# Patient Record
Sex: Female | Born: 1970
Health system: Southern US, Community
[De-identification: ages and names within clinical notes are randomized; demographics above are authoritative.]

## PROBLEM LIST (undated history)

## (undated) DIAGNOSIS — N200 Calculus of kidney: Secondary | ICD-10-CM

## (undated) DIAGNOSIS — N809 Endometriosis, unspecified: Secondary | ICD-10-CM

## (undated) DIAGNOSIS — F419 Anxiety disorder, unspecified: Secondary | ICD-10-CM

## (undated) DIAGNOSIS — K589 Irritable bowel syndrome without diarrhea: Secondary | ICD-10-CM

## (undated) DIAGNOSIS — I10 Essential (primary) hypertension: Secondary | ICD-10-CM

## (undated) DIAGNOSIS — I2510 Atherosclerotic heart disease of native coronary artery without angina pectoris: Secondary | ICD-10-CM

## (undated) HISTORY — DX: Essential (primary) hypertension: I10

## (undated) HISTORY — DX: Calculus of kidney: N20.0

## (undated) HISTORY — PX: LAPAROSCOPIC ABDOMINAL EXPLORATION: SHX6249

## (undated) HISTORY — DX: Endometriosis, unspecified: N80.9

## (undated) HISTORY — DX: Atherosclerotic heart disease of native coronary artery without angina pectoris: I25.10

## (undated) HISTORY — DX: Irritable bowel syndrome, unspecified: K58.9

## (undated) HISTORY — PX: CHOLECYSTECTOMY: SHX55

---

## 1997-09-03 ENCOUNTER — Other Ambulatory Visit: Admission: RE | Admit: 1997-09-03 | Discharge: 1997-09-03 | Payer: Self-pay | Admitting: Obstetrics & Gynecology

## 1997-11-28 ENCOUNTER — Encounter: Admission: RE | Admit: 1997-11-28 | Discharge: 1998-02-26 | Payer: Self-pay | Admitting: Obstetrics and Gynecology

## 1997-12-06 ENCOUNTER — Inpatient Hospital Stay (HOSPITAL_COMMUNITY): Admission: AD | Admit: 1997-12-06 | Discharge: 1997-12-07 | Payer: Self-pay | Admitting: Obstetrics & Gynecology

## 1998-02-03 ENCOUNTER — Inpatient Hospital Stay (HOSPITAL_COMMUNITY): Admission: AD | Admit: 1998-02-03 | Discharge: 1998-02-03 | Payer: Self-pay | Admitting: Obstetrics and Gynecology

## 1998-02-10 ENCOUNTER — Inpatient Hospital Stay (HOSPITAL_COMMUNITY): Admission: AD | Admit: 1998-02-10 | Discharge: 1998-02-10 | Payer: Self-pay | Admitting: Obstetrics and Gynecology

## 1998-02-24 ENCOUNTER — Inpatient Hospital Stay (HOSPITAL_COMMUNITY): Admission: AD | Admit: 1998-02-24 | Discharge: 1998-02-26 | Payer: Self-pay | Admitting: Obstetrics and Gynecology

## 1998-03-25 ENCOUNTER — Encounter (HOSPITAL_COMMUNITY): Admission: RE | Admit: 1998-03-25 | Discharge: 1998-06-23 | Payer: Self-pay | Admitting: Obstetrics & Gynecology

## 1998-04-08 ENCOUNTER — Other Ambulatory Visit: Admission: RE | Admit: 1998-04-08 | Discharge: 1998-04-08 | Payer: Self-pay | Admitting: Obstetrics & Gynecology

## 1998-07-13 ENCOUNTER — Encounter (HOSPITAL_COMMUNITY): Admission: RE | Admit: 1998-07-13 | Discharge: 1998-10-11 | Payer: Self-pay | Admitting: *Deleted

## 1998-10-02 ENCOUNTER — Emergency Department (HOSPITAL_COMMUNITY): Admission: EM | Admit: 1998-10-02 | Discharge: 1998-10-02 | Payer: Self-pay | Admitting: Emergency Medicine

## 1998-10-30 ENCOUNTER — Ambulatory Visit (HOSPITAL_COMMUNITY): Admission: RE | Admit: 1998-10-30 | Discharge: 1998-10-30 | Payer: Self-pay | Admitting: Internal Medicine

## 1998-10-30 ENCOUNTER — Encounter: Payer: Self-pay | Admitting: Internal Medicine

## 1999-03-10 ENCOUNTER — Encounter: Payer: Self-pay | Admitting: Internal Medicine

## 1999-03-10 ENCOUNTER — Encounter: Admission: RE | Admit: 1999-03-10 | Discharge: 1999-03-10 | Payer: Self-pay | Admitting: Internal Medicine

## 1999-03-30 ENCOUNTER — Other Ambulatory Visit: Admission: RE | Admit: 1999-03-30 | Discharge: 1999-03-30 | Payer: Self-pay | Admitting: Obstetrics & Gynecology

## 1999-08-09 ENCOUNTER — Encounter: Payer: Self-pay | Admitting: Internal Medicine

## 1999-08-09 ENCOUNTER — Encounter: Admission: RE | Admit: 1999-08-09 | Discharge: 1999-08-09 | Payer: Self-pay | Admitting: Internal Medicine

## 2000-07-04 ENCOUNTER — Other Ambulatory Visit: Admission: RE | Admit: 2000-07-04 | Discharge: 2000-07-04 | Payer: Self-pay | Admitting: Obstetrics & Gynecology

## 2001-07-24 ENCOUNTER — Encounter: Payer: Self-pay | Admitting: Obstetrics and Gynecology

## 2001-07-24 ENCOUNTER — Ambulatory Visit (HOSPITAL_COMMUNITY): Admission: RE | Admit: 2001-07-24 | Discharge: 2001-07-24 | Payer: Self-pay | Admitting: Ophthalmology

## 2001-08-06 ENCOUNTER — Inpatient Hospital Stay (HOSPITAL_COMMUNITY): Admission: AD | Admit: 2001-08-06 | Discharge: 2001-08-06 | Payer: Self-pay | Admitting: Obstetrics & Gynecology

## 2001-08-09 ENCOUNTER — Inpatient Hospital Stay (HOSPITAL_COMMUNITY): Admission: AD | Admit: 2001-08-09 | Discharge: 2001-08-09 | Payer: Self-pay | Admitting: Obstetrics and Gynecology

## 2001-08-27 ENCOUNTER — Inpatient Hospital Stay (HOSPITAL_COMMUNITY): Admission: AD | Admit: 2001-08-27 | Discharge: 2001-08-29 | Payer: Self-pay | Admitting: Obstetrics and Gynecology

## 2001-08-30 ENCOUNTER — Encounter: Admission: RE | Admit: 2001-08-30 | Discharge: 2001-09-29 | Payer: Self-pay | Admitting: Obstetrics & Gynecology

## 2001-08-30 ENCOUNTER — Observation Stay (HOSPITAL_COMMUNITY): Admission: AD | Admit: 2001-08-30 | Discharge: 2001-08-31 | Payer: Self-pay | Admitting: Obstetrics and Gynecology

## 2001-10-01 ENCOUNTER — Encounter: Admission: RE | Admit: 2001-10-01 | Discharge: 2001-10-31 | Payer: Self-pay | Admitting: Obstetrics & Gynecology

## 2001-10-16 ENCOUNTER — Other Ambulatory Visit: Admission: RE | Admit: 2001-10-16 | Discharge: 2001-10-16 | Payer: Self-pay | Admitting: Obstetrics & Gynecology

## 2001-11-30 ENCOUNTER — Encounter: Admission: RE | Admit: 2001-11-30 | Discharge: 2001-12-30 | Payer: Self-pay | Admitting: Obstetrics & Gynecology

## 2001-12-03 ENCOUNTER — Emergency Department (HOSPITAL_COMMUNITY): Admission: EM | Admit: 2001-12-03 | Discharge: 2001-12-03 | Payer: Self-pay | Admitting: Emergency Medicine

## 2001-12-05 ENCOUNTER — Encounter: Payer: Self-pay | Admitting: Internal Medicine

## 2001-12-05 ENCOUNTER — Encounter: Admission: RE | Admit: 2001-12-05 | Discharge: 2001-12-05 | Payer: Self-pay | Admitting: Internal Medicine

## 2002-01-30 ENCOUNTER — Encounter: Admission: RE | Admit: 2002-01-30 | Discharge: 2002-03-01 | Payer: Self-pay | Admitting: Obstetrics & Gynecology

## 2002-04-01 ENCOUNTER — Encounter: Admission: RE | Admit: 2002-04-01 | Discharge: 2002-05-01 | Payer: Self-pay | Admitting: Obstetrics & Gynecology

## 2002-05-02 ENCOUNTER — Encounter: Admission: RE | Admit: 2002-05-02 | Discharge: 2002-06-01 | Payer: Self-pay | Admitting: Obstetrics & Gynecology

## 2002-05-22 ENCOUNTER — Encounter: Admission: RE | Admit: 2002-05-22 | Discharge: 2002-05-22 | Payer: Self-pay | Admitting: Internal Medicine

## 2002-05-22 ENCOUNTER — Encounter: Payer: Self-pay | Admitting: Internal Medicine

## 2002-06-03 ENCOUNTER — Encounter: Admission: RE | Admit: 2002-06-03 | Discharge: 2002-06-03 | Payer: Self-pay | Admitting: Internal Medicine

## 2002-06-03 ENCOUNTER — Encounter: Payer: Self-pay | Admitting: Internal Medicine

## 2002-06-10 ENCOUNTER — Encounter: Payer: Self-pay | Admitting: Internal Medicine

## 2002-06-10 ENCOUNTER — Encounter: Admission: RE | Admit: 2002-06-10 | Discharge: 2002-06-10 | Payer: Self-pay | Admitting: Internal Medicine

## 2002-06-21 ENCOUNTER — Encounter: Admission: RE | Admit: 2002-06-21 | Discharge: 2002-06-21 | Payer: Self-pay | Admitting: Gastroenterology

## 2002-06-21 ENCOUNTER — Encounter: Payer: Self-pay | Admitting: Gastroenterology

## 2002-07-01 ENCOUNTER — Encounter: Admission: RE | Admit: 2002-07-01 | Discharge: 2002-07-31 | Payer: Self-pay | Admitting: Obstetrics & Gynecology

## 2002-12-19 ENCOUNTER — Other Ambulatory Visit: Admission: RE | Admit: 2002-12-19 | Discharge: 2002-12-19 | Payer: Self-pay | Admitting: Obstetrics & Gynecology

## 2002-12-24 ENCOUNTER — Encounter: Admission: RE | Admit: 2002-12-24 | Discharge: 2002-12-24 | Payer: Self-pay | Admitting: Obstetrics & Gynecology

## 2002-12-24 ENCOUNTER — Encounter: Payer: Self-pay | Admitting: Obstetrics & Gynecology

## 2003-03-25 ENCOUNTER — Encounter: Admission: RE | Admit: 2003-03-25 | Discharge: 2003-03-25 | Payer: Self-pay | Admitting: Family Medicine

## 2003-04-15 ENCOUNTER — Encounter: Admission: RE | Admit: 2003-04-15 | Discharge: 2003-04-15 | Payer: Self-pay | Admitting: Internal Medicine

## 2003-06-16 ENCOUNTER — Emergency Department (HOSPITAL_COMMUNITY): Admission: EM | Admit: 2003-06-16 | Discharge: 2003-06-16 | Payer: Self-pay | Admitting: Emergency Medicine

## 2003-06-26 ENCOUNTER — Ambulatory Visit (HOSPITAL_COMMUNITY): Admission: RE | Admit: 2003-06-26 | Discharge: 2003-06-26 | Payer: Self-pay | Admitting: Gastroenterology

## 2004-05-06 ENCOUNTER — Other Ambulatory Visit: Admission: RE | Admit: 2004-05-06 | Discharge: 2004-05-06 | Payer: Self-pay | Admitting: Obstetrics & Gynecology

## 2004-10-19 IMAGING — CT CT PARANASAL SINUSES LIMITED
1 series · 16 of 28 positions shown, 20 images · non-contrast
Comparison: none

CLINICAL DATA: Three weeks headaches.
 LIMITED PARANASAL SINUS CT ? NO CONTRAST

[Series 2: limited sinus · axial · 0.33mm/px · z∈[-4,+95]mm · 16 of 28 slices shown, 20 images]
[im 2/28  brain]
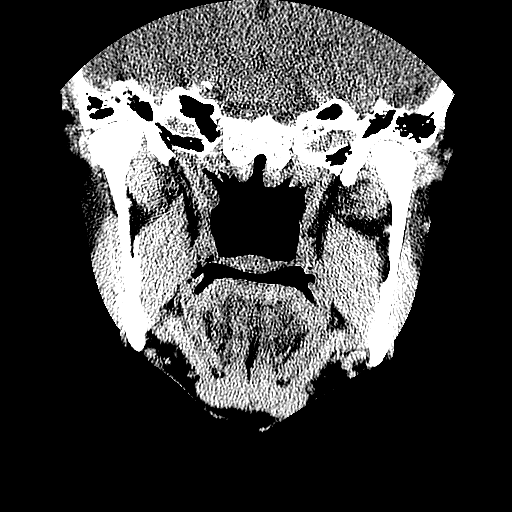
[im 2/28  bone]
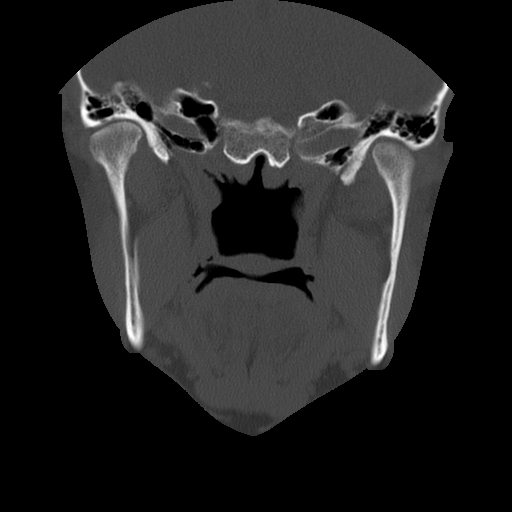
[im 4/28  bone]
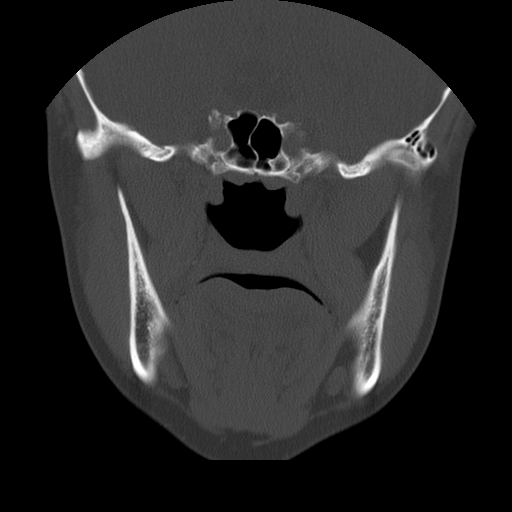
[im 6/28  bone]
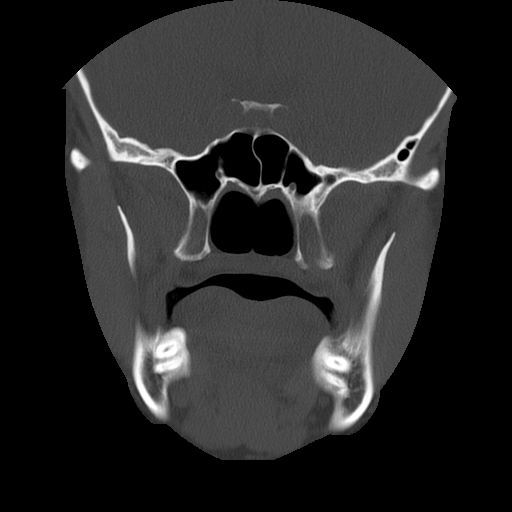
[im 7/28  bone]
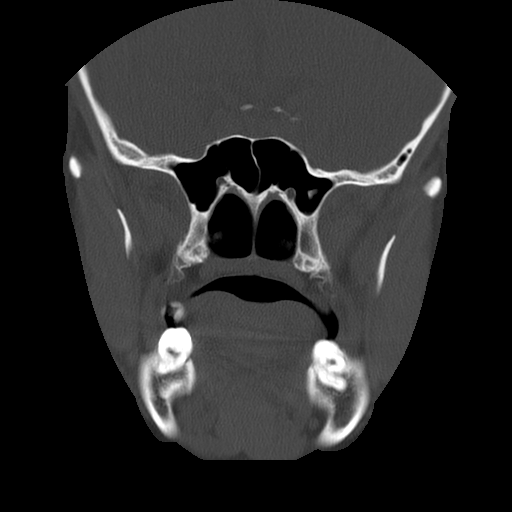
[im 9/28  brain]
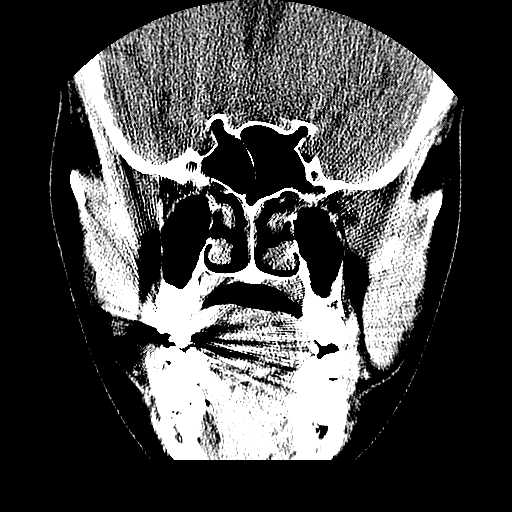
[im 9/28  bone]
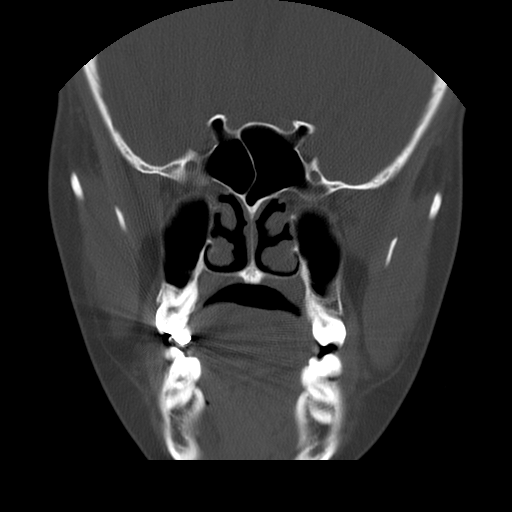
[im 10/28  bone]
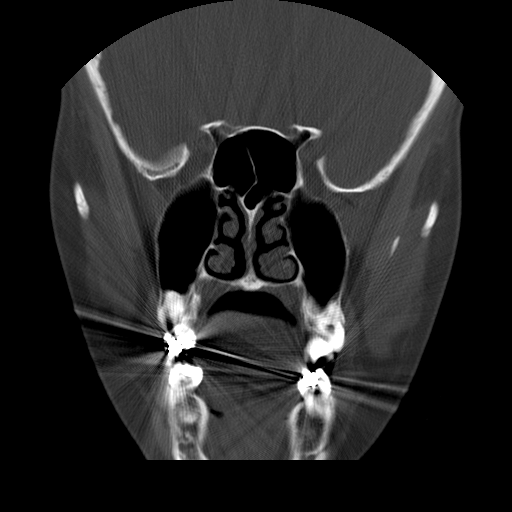
[im 12/28  bone]
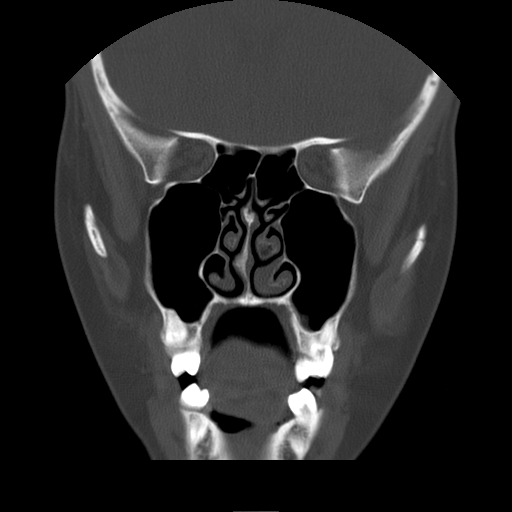
[im 14/28  bone]
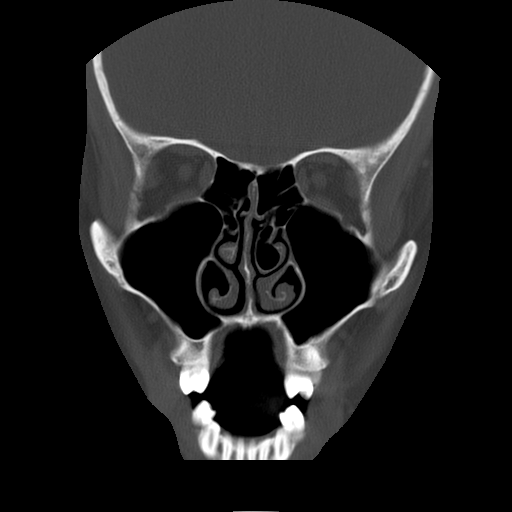
[im 15/28  brain]
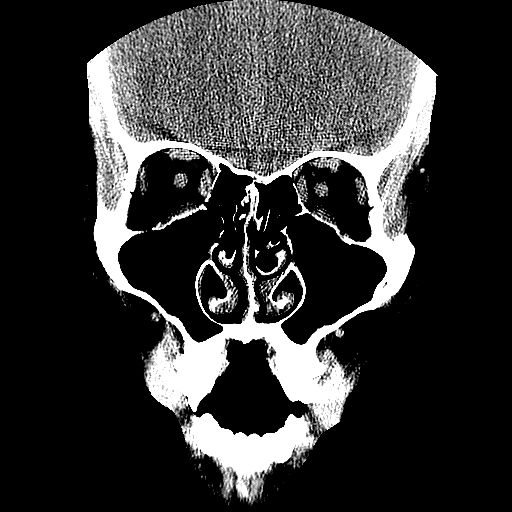
[im 15/28  bone]
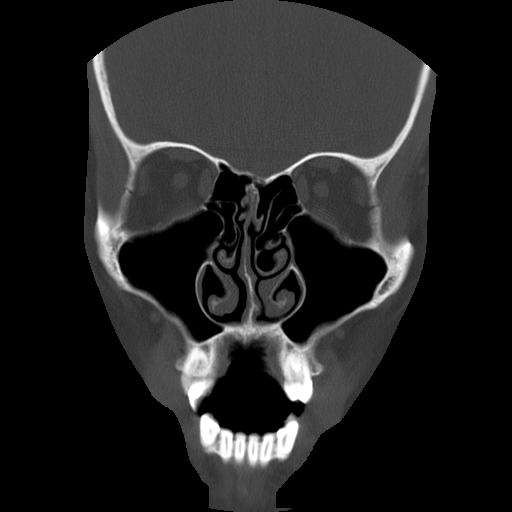
[im 17/28  bone]
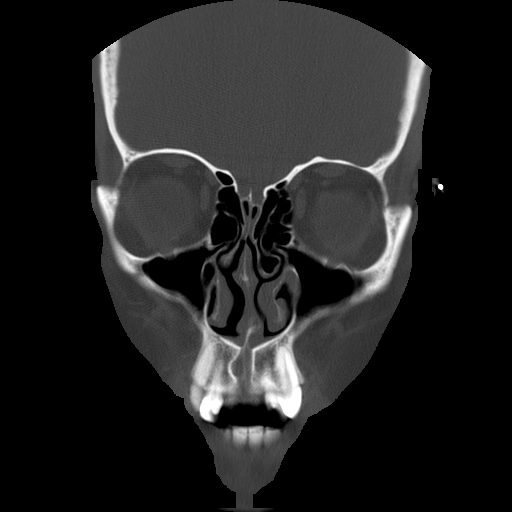
[im 19/28  bone]
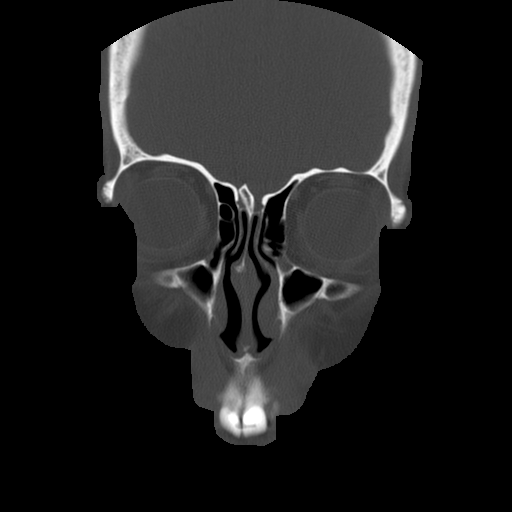
[im 20/28  bone]
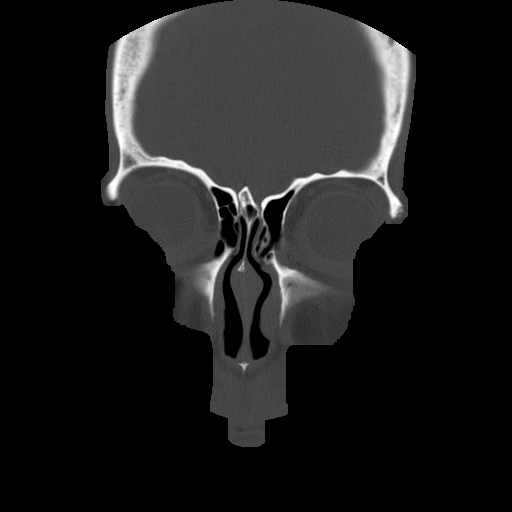
[im 22/28  brain]
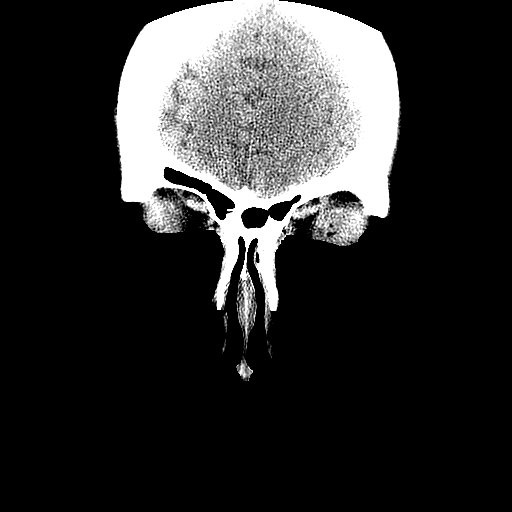
[im 22/28  bone]
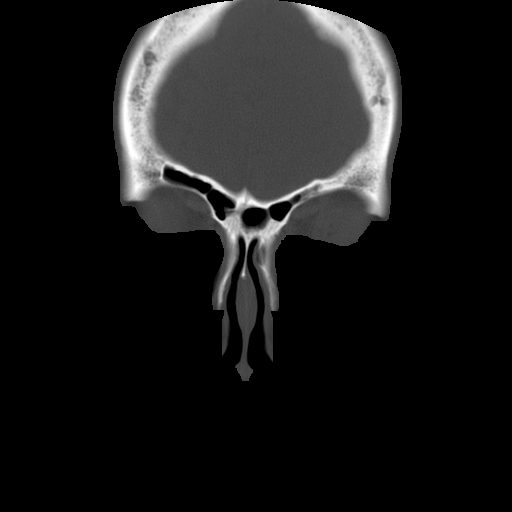
[im 23/28  bone]
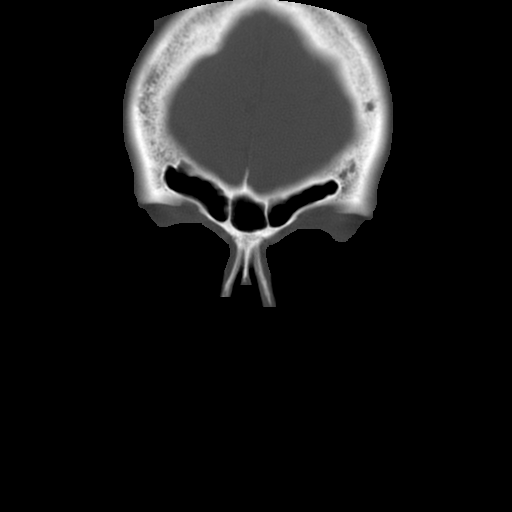
[im 25/28  bone]
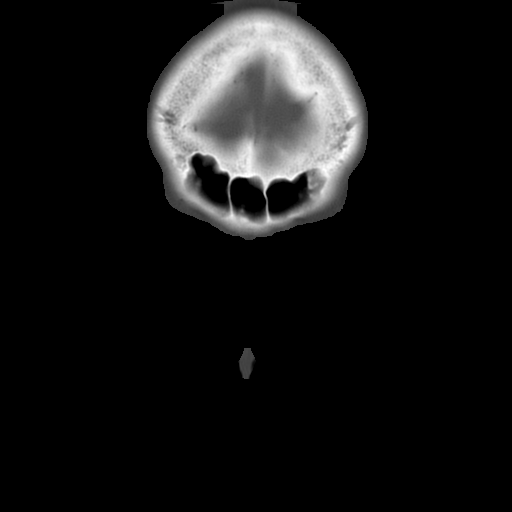
[im 27/28  bone]
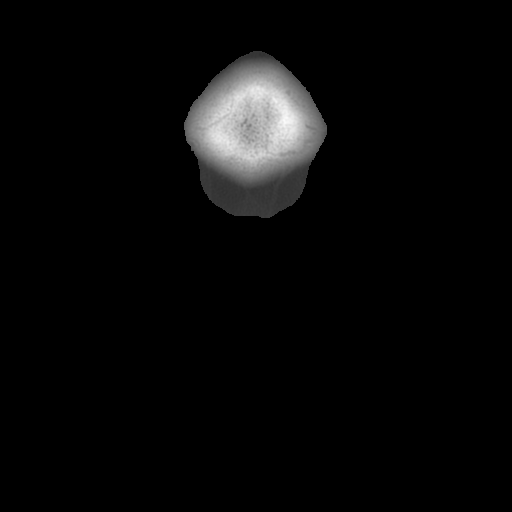

[16 of 28 positions shown; findings below may reference images not displayed]

FINDINGS: Selected direct coronal images were obtained through the major paranasal sinuses with no IV contrast.  The paranasal sinuses appear clear.  Slight rightward nasal septal deviation is seen at the inferior nasal septum. Moderate right concha bullosa anatomic variant is noted.  
 IMPRESSION
 1.  Slight inferior rightward nasal septal deviation.
 2.  Right concha bullosa anatomic variant.
 3.  Clear paranasal sinuses.

## 2006-09-15 ENCOUNTER — Encounter: Admission: RE | Admit: 2006-09-15 | Discharge: 2006-09-15 | Payer: Self-pay | Admitting: Gastroenterology

## 2010-07-23 NOTE — Op Note (Signed)
NAME:  [name], [name]                            ACCOUNT NO.:  [id number]   MEDICAL RECORD NO.:  [id number]                   PATIENT TYPE:  AMB   LOCATION:  ENDO                                 FACILITY:  [facility]   PHYSICIAN:  Danise Edge, M.D.                DATE OF BIRTH:  12-Nov-1970   DATE OF PROCEDURE:  06/26/2003  DATE OF DISCHARGE:                                 OPERATIVE REPORT   PROCEDURE:  Esophagogastroduodenoscopy.   REFERRED BY:  Ike Bene, M.D.   INDICATIONS FOR PROCEDURE:  [name] is a 40 year old female born [birth year].  [name] takes Nexium each morning to control her  gastroesophageal reflux. Despite taking Nexium daily, she has discomfort  when she takes a deep breath in her chest and also intermittent abdominal  discomfort without dysphagia or odynophagia.   ENDOSCOPIST:  Danise Edge, M.D.   PREMEDICATION:  Versed 7.5 mg, Demerol 80 mg.   DESCRIPTION OF PROCEDURE:  After obtaining informed consent, [name] was  placed in the left lateral decubitus position. I administered intravenous  Demerol and intravenous Versed to achieve conscious sedation for the  procedure. The patient's blood pressure, oxygen saturation and cardiac  rhythm were monitored throughout the procedure and documented in the medical  record.   The Olympus gastroscope was passed through the posterior hypopharynx into  the proximal esophagus without difficulty. The hypopharynx and larynx  appeared normal. I did not visualize the vocal cords.   ESOPHAGOSCOPY:  The proximal, mid and lower segments of the esophageal  mucosa appeared completely normal. The squamocolumnar junction and  esophagogastric junction are noted at 40 cm from the incisor teeth.  Endoscopically, there is no evidence for the presence of erosive  esophagitis, esophageal mucosal scarring or Barrett's esophagus.   GASTROSCOPY:  Retroflexed view of the gastric cardia and fundus was normal.  The gastric  body, antrum and pylorus appeared normal.   DUODENOSCOPY:  The duodenal bulb, mid duodenum and distal duodenum appear  normal.   ASSESSMENT:  Normal esophagogastroduodenoscopy.                                               Danise Edge, M.D.    MJ/MEDQ  D:  06/26/2003  T:  06/27/2003  Job:  [id number]   cc:   Ike Bene, M.D.  [address]  [place]  [place] [address]  Fax: [phone]

## 2012-10-02 ENCOUNTER — Encounter: Payer: Self-pay | Admitting: Physician Assistant

## 2012-10-02 ENCOUNTER — Ambulatory Visit (INDEPENDENT_AMBULATORY_CARE_PROVIDER_SITE_OTHER): Payer: BC Managed Care – PPO | Admitting: Physician Assistant

## 2012-10-02 VITALS — BP 142/102 | HR 85 | Temp 97.3°F | Ht 66.0 in | Wt 175.0 lb

## 2012-10-02 DIAGNOSIS — K589 Irritable bowel syndrome without diarrhea: Secondary | ICD-10-CM

## 2012-10-02 DIAGNOSIS — K625 Hemorrhage of anus and rectum: Secondary | ICD-10-CM

## 2012-10-02 DIAGNOSIS — N809 Endometriosis, unspecified: Secondary | ICD-10-CM

## 2012-10-02 DIAGNOSIS — R109 Unspecified abdominal pain: Secondary | ICD-10-CM

## 2012-10-02 LAB — POCT CBC
Granulocyte percent: 69 %G (ref 37–80)
HCT, POC: 43.6 % (ref 37.7–47.9)
Hemoglobin: 14.2 g/dL (ref 12.2–16.2)
Lymph, poc: 1.8 (ref 0.6–3.4)
MCH, POC: 28.9 pg (ref 27–31.2)
MCHC: 32.5 g/dL (ref 31.8–35.4)
MCV: 88.8 fL (ref 80–97)
MPV: 8.5 fL (ref 0–99.8)
POC Granulocyte: 4.8 (ref 2–6.9)
POC LYMPH PERCENT: 25.7 %L (ref 10–50)
Platelet Count, POC: 218 10*3/uL (ref 142–424)
RBC: 4.9 M/uL (ref 4.04–5.48)
RDW, POC: 13.3 %
WBC: 6.9 10*3/uL (ref 4.6–10.2)

## 2012-10-02 NOTE — Progress Notes (Signed)
Subjective:     Patient ID: Kelly Salazar, female   DOB: [birth year], 42 y.o.   MRN: [id number]  HPI Pt with a sudden onset of N/V, diarrhea, and rectal bleeding She states there is a hx of IBS that has been predom. Constipation Then she had her gallbladder removed and since has had diarrhea She has tried OTC meds for sx relief but they have had not helped On occasion she has not been able to make it to the restroom Last pm she had sudden onset of abd pain that then turned to N/V Sh has also had rectal bleeding This is bright red and changing the color of the water No hx of same There is FH of Colon Ca in maternal grandmother  Review of Systems  All other systems reviewed and are negative.       Objective:   Physical Exam  Vitals reviewed. Heart- RRR w/o M Lungs- CTA Abd- Soft,Diffuse TTP, no guarding, no masses/HSM CBC- see labs Rectal- Hemoccult positive, no masses Anoscope-  + internal hem.     Assessment:     Abd pain Rectal bleeding    Plan:     Given recent sx, chronic hx, and FH referral to GI Bland diet Hold caff, nict use, and OTC NSAIDS F/U with GI

## 2012-10-02 NOTE — Patient Instructions (Signed)
Rectal Bleeding Rectal bleeding is when blood passes out of the anus. It is usually a sign that something is wrong. It may not be serious, but it should always be evaluated. Rectal bleeding may present as bright red blood or extremely dark stools. The color may range from dark red or maroon to black (like tar). It is important that the cause of rectal bleeding be identified so treatment can be started and the problem corrected. CAUSES   Hemorrhoids. These are enlarged (dilated) blood vessels or veins in the anal or rectal area.  Fistulas. Theseare abnormal, burrowing channels that usually run from inside the rectum to the skin around the anus. They can bleed.  Anal fissures. This is a tear in the tissue of the anus. Bleeding occurs with bowel movements.  Diverticulosis. This is a condition in which pockets or sacs project from the bowel wall. Occasionally, the sacs can bleed.  Diverticulitis. Thisis an infection involving diverticulosis of the colon.  Proctitis and colitis. These are conditions in which the rectum, colon, or both, can become inflamed and pitted (ulcerated).  Polyps and cancer. Polyps are non-cancerous (benign) growths in the colon that may bleed. Certain types of polyps turn into cancer.  Protrusion of the rectum. Part of the rectum can project from the anus and bleed.  Certain medicines.  Intestinal infections.  Blood vessel abnormalities. HOME CARE INSTRUCTIONS  Eat a high-fiber diet to keep your stool soft.  Limit activity.  Drink enough fluids to keep your urine clear or pale yellow.  Warm baths may be useful to soothe rectal pain.  Follow up with your caregiver as directed. SEEK IMMEDIATE MEDICAL CARE IF:  You develop increased bleeding.  You have black or dark red stools.  You vomit blood or material that looks like coffee grounds.  You have abdominal pain or tenderness.  You have a fever.  You feel weak, nauseous, or you faint.  You have  severe rectal pain or you are unable to have a bowel movement. MAKE SURE YOU:  Understand these instructions.  Will watch your condition.  Will get help right away if you are not doing well or get worse. Document Released: 08/13/2001 Document Revised: 05/16/2011 Document Reviewed: 08/08/2010 Exit[employer] Patient Information 2014 [employer].  

## 2012-10-08 ENCOUNTER — Other Ambulatory Visit (INDEPENDENT_AMBULATORY_CARE_PROVIDER_SITE_OTHER): Payer: Self-pay | Admitting: *Deleted

## 2012-10-08 ENCOUNTER — Encounter (INDEPENDENT_AMBULATORY_CARE_PROVIDER_SITE_OTHER): Payer: Self-pay | Admitting: Internal Medicine

## 2012-10-08 ENCOUNTER — Telehealth (INDEPENDENT_AMBULATORY_CARE_PROVIDER_SITE_OTHER): Payer: Self-pay | Admitting: *Deleted

## 2012-10-08 ENCOUNTER — Ambulatory Visit (INDEPENDENT_AMBULATORY_CARE_PROVIDER_SITE_OTHER): Payer: BC Managed Care – PPO | Admitting: Internal Medicine

## 2012-10-08 VITALS — BP 112/80 | HR 66 | Ht 66.0 in | Wt 175.7 lb

## 2012-10-08 DIAGNOSIS — R197 Diarrhea, unspecified: Secondary | ICD-10-CM

## 2012-10-08 DIAGNOSIS — R110 Nausea: Secondary | ICD-10-CM

## 2012-10-08 DIAGNOSIS — Z1211 Encounter for screening for malignant neoplasm of colon: Secondary | ICD-10-CM

## 2012-10-08 DIAGNOSIS — K625 Hemorrhage of anus and rectum: Secondary | ICD-10-CM

## 2012-10-08 MED ORDER — PEG-KCL-NACL-NASULF-NA ASC-C 100 G PO SOLR: 1.0000 | Freq: Once | ORAL | Status: DC

## 2012-10-08 MED ORDER — ONDANSETRON HCL 4 MG PO TABS: 4.0000 mg | ORAL_TABLET | Freq: Three times a day (TID) | ORAL | Status: DC | PRN

## 2012-10-08 NOTE — Patient Instructions (Addendum)
Colonoscopy with Dr. Rehman. The risks and benefits such as perforation, bleeding, and infection were reviewed with the patient and is agreeable. 

## 2012-10-08 NOTE — Telephone Encounter (Signed)
Patient needs movi prep 

## 2012-10-08 NOTE — Progress Notes (Signed)
Subjective:     Patient ID: Kelly Salazar, female   DOB: 11-Nov-1970, 42 y.o.   MRN: [id number]  HPI Referred to our office by Dr. Zenda Alpers and Ignacia Bayley for rectal bleeding. Last Monday night (1 week ago). Noted to be heme positive. Anoscope reveal internal hemorrhoids, thrombosed She had a painful BM. She vomited x 1. She went to the bathroom and noticed bright red rectal bleeding. She had the rectal bleeding for 48 hrs. Diarrhea was associated with her symptoms.Diarrhea off and on x 5 months.  She had diarrhea before the rectal bleeding. She took Imodium and the diarrhea stopped. There has been no weight loss. Appetite is good for the most part. She has frequent nausea and takes Phenergan for the nausea. She has some abdominal pain today but she just started her period today. She usually has a BM x 5 a day. She also has fecal incontinence.  She takes Imodium and Metamucil for her diarrhea.  She says her BMs are extremely painful. She has nausea when she has the abdominal cramps.   Family Hx of colon cancer in a grandmother at age 48 Hemocult positive in PCP on 10/04/2012  CBC    Component Value Date/Time   WBC 6.9 10/02/2012 1320   RBC 4.9 10/02/2012 1320   HGB 14.2 10/02/2012 1320   HCT 43.6 10/02/2012 1320   MCV 88.8 10/02/2012 1320   MCH 28.9 10/02/2012 1320   MCHC 32.5 10/02/2012 1320   06/26/2003 EGD: GERD: Dr. Robin Searing: Normal EGD    Review of Systems  Current Outpatient Prescriptions  Medication Sig Dispense Refill  . ALPRAZolam (XANAX) 1 MG tablet Take 1 mg by mouth 3 (three) times daily as needed.      . nebivolol (BYSTOLIC) 2.5 MG tablet Take 2.5 mg by mouth daily.      Marland Kitchen oxyCODONE-acetaminophen (PERCOCET/ROXICET) 5-325 MG per tablet Take 1 tablet by mouth every 8 (eight) hours as needed for pain. Takes one to two days per month with period       No current facility-administered medications for this visit.   Past Medical History  Diagnosis Date  . IBS (irritable  bowel syndrome)   . Hypertension   . Endometriosis   . Kidney stones    Past Surgical History  Procedure Laterality Date  . Cholecystectomy    . Laparoscopic abdominal exploration      looking for endometriosis   Past Surgical History  Procedure Laterality Date  . Cholecystectomy    . Laparoscopic abdominal exploration      looking for endometriosis   Allergies  Allergen Reactions  . Penicillins Shortness Of Breath       Objective:   Physical Exam Alert and oriented. Skin warm and dry. Oral mucosa is moist.   . Sclera anicteric, conjunctivae is pink. Thyroid not enlarged. No cervical lymphadenopathy. Lungs clear. Heart regular rate and rhythm.  Abdomen is soft. Bowel sounds are positive. No hepatomegaly. No abdominal masses felt. No tenderness.  No edema to lower extremities.  Rectal exam deferred.     Assessment:    Rectal bleeding. Colonic neoplasm needs to be ruled out. Family hx of colon cancer. Nausea probably related to her abdominal cramps.     Plan:    Colonoscopy with Dr Karilyn Cota Rx for Zofran eprescribed to her pharmacy.

## 2012-10-11 ENCOUNTER — Encounter (HOSPITAL_COMMUNITY): Payer: Self-pay | Admitting: Pharmacy Technician

## 2012-10-31 ENCOUNTER — Encounter (HOSPITAL_COMMUNITY)
Admission: RE | Disposition: A | Discharge status: Home / Self Care | Payer: Self-pay | Source: Ambulatory Visit | Attending: Internal Medicine

## 2012-10-31 ENCOUNTER — Encounter (HOSPITAL_COMMUNITY): Payer: Self-pay | Admitting: *Deleted

## 2012-10-31 ENCOUNTER — Ambulatory Visit (HOSPITAL_COMMUNITY)
Admission: RE | Admit: 2012-10-31 | Discharge: 2012-10-31 | Disposition: A | Discharge status: Home / Self Care | Payer: BC Managed Care – PPO | Source: Ambulatory Visit | Attending: Internal Medicine | Admitting: Internal Medicine

## 2012-10-31 DIAGNOSIS — R198 Other specified symptoms and signs involving the digestive system and abdomen: Secondary | ICD-10-CM | POA: Insufficient documentation

## 2012-10-31 DIAGNOSIS — K921 Melena: Secondary | ICD-10-CM | POA: Insufficient documentation

## 2012-10-31 DIAGNOSIS — K644 Residual hemorrhoidal skin tags: Secondary | ICD-10-CM

## 2012-10-31 DIAGNOSIS — R197 Diarrhea, unspecified: Secondary | ICD-10-CM

## 2012-10-31 DIAGNOSIS — K6281 Anal sphincter tear (healed) (nontraumatic) (old): Secondary | ICD-10-CM

## 2012-10-31 DIAGNOSIS — Z8371 Family history of colonic polyps: Secondary | ICD-10-CM

## 2012-10-31 DIAGNOSIS — Z800 Family history of malignant neoplasm of digestive organs: Secondary | ICD-10-CM | POA: Insufficient documentation

## 2012-10-31 DIAGNOSIS — I10 Essential (primary) hypertension: Secondary | ICD-10-CM | POA: Insufficient documentation

## 2012-10-31 DIAGNOSIS — K602 Anal fissure, unspecified: Secondary | ICD-10-CM | POA: Insufficient documentation

## 2012-10-31 DIAGNOSIS — K625 Hemorrhage of anus and rectum: Secondary | ICD-10-CM

## 2012-10-31 DIAGNOSIS — R110 Nausea: Secondary | ICD-10-CM

## 2012-10-31 HISTORY — DX: Anxiety disorder, unspecified: F41.9

## 2012-10-31 HISTORY — PX: COLONOSCOPY: SHX5424

## 2012-10-31 SURGERY — COLONOSCOPY
Anesthesia: Moderate Sedation

## 2012-10-31 MED ORDER — MIDAZOLAM HCL 5 MG/5ML IJ SOLN
INTRAMUSCULAR | Status: AC
  Filled 2012-10-31: qty 10

## 2012-10-31 MED ORDER — DICYCLOMINE HCL 10 MG PO CAPS: 10.0000 mg | ORAL_CAPSULE | Freq: Two times a day (BID) | ORAL | Status: DC

## 2012-10-31 MED ORDER — MIDAZOLAM HCL 5 MG/5ML IJ SOLN
INTRAMUSCULAR | Status: DC | PRN
  Administered 2012-10-31: 3 mg via INTRAVENOUS
  Administered 2012-10-31 (×2): 2 mg via INTRAVENOUS
  Administered 2012-10-31: 3 mg via INTRAVENOUS
  Administered 2012-10-31: 2 mg via INTRAVENOUS

## 2012-10-31 MED ORDER — DOCUSATE SODIUM 100 MG PO CAPS: 200.0000 mg | ORAL_CAPSULE | Freq: Every day | ORAL | Status: DC

## 2012-10-31 MED ORDER — MEPERIDINE HCL 50 MG/ML IJ SOLN
INTRAMUSCULAR | Status: DC | PRN
  Administered 2012-10-31 (×2): 25 mg via INTRAVENOUS

## 2012-10-31 MED ORDER — PSYLLIUM 28 % PO PACK: 1.0000 | PACK | Freq: Every day | ORAL | Status: DC

## 2012-10-31 MED ORDER — PROMETHAZINE HCL 25 MG/ML IJ SOLN
INTRAMUSCULAR | Status: DC | PRN
  Administered 2012-10-31 (×2): 12.5 mg via INTRAVENOUS

## 2012-10-31 MED ORDER — SODIUM CHLORIDE 0.9 % IV SOLN
INTRAVENOUS | Status: DC
  Administered 2012-10-31: 12:00:00 via INTRAVENOUS

## 2012-10-31 MED ORDER — SODIUM CHLORIDE 0.9 % IJ SOLN
INTRAMUSCULAR | Status: AC
  Filled 2012-10-31: qty 10

## 2012-10-31 MED ORDER — PROMETHAZINE HCL 25 MG/ML IJ SOLN
INTRAMUSCULAR | Status: AC
  Filled 2012-10-31: qty 1

## 2012-10-31 MED ORDER — STERILE WATER FOR IRRIGATION IR SOLN
Status: DC | PRN
  Administered 2012-10-31: 12:00:00

## 2012-10-31 MED ORDER — MEPERIDINE HCL 50 MG/ML IJ SOLN
INTRAMUSCULAR | Status: AC
  Filled 2012-10-31: qty 1

## 2012-10-31 NOTE — Op Note (Addendum)
COLONOSCOPY PROCEDURE REPORT  PATIENT:  Kelly Salazar  MR#:  [id number] Birthdate:  1970-09-20, 42 y.o., female Endoscopist:  Dr. Malissa Hippo, MD Referred By:  Dr. Rudi Heap, MD Procedure Date: 10/31/2012  Procedure:   Colonoscopy  Indications:  Patient is a 42 year old Caucasian female with history of large volume hematochezia irregular bowel movements and painful defecation. Family history significant for colon carcinoma in maternal grandmother at age 82. Father has had multiple colonic polyps removed.  Informed Consent:  The procedure and risks were reviewed with the patient and informed consent was obtained.  Medications:  Demerol 50 mg IV Versed 12 mg IV Promethazine 25 mg IV and diluted form.  Description of procedure:  After a digital rectal exam was performed, that colonoscope was advanced from the anus through the rectum and colon to the area of the cecum, ileocecal valve and appendiceal orifice. The cecum was deeply intubated. These structures were well-seen and photographed for the record. From the level of the cecum and ileocecal valve, the scope was slowly and cautiously withdrawn. The mucosal surfaces were carefully surveyed utilizing scope tip to flexion to facilitate fold flattening as needed. The scope was pulled down into the rectum where a thorough exam including retroflexion was performed. Terminal ileum was also examined.  Findings:   Prep excellent. Normal mucosa of terminal ileum. Normal mucosa of colon and rectum. No evidence of colonic polyps. Small hemorrhoids below the dentate line along with small linear tear she appears to be healing.    Therapeutic/Diagnostic Maneuvers Performed:  None  Complications:  None  Cecal Withdrawal Time:  9 minutes  Impression:  Normal mucosa of terminal ileum. Normal colonoscopy except external hemorrhoids and small healing anal fissure.  Recommendations:  Standard instructions given. Dicyclomine 10 mg by mouth  twice a day. Colace 200 mg by mouth each bedtime. Metamucil 4 g by mouth daily. Office visit in 3 months.  Kelly Salazar U  10/31/2012 1:03 PM  CC: Dr. Rudi Heap, MD & Dr. Bonnetta Barry ref. provider found

## 2012-10-31 NOTE — H&P (Signed)
Kelly Salazar is an 42 y.o. female.   Chief Complaint: Patient is here for colonoscopy. HPI: Patient is a 42 year old Caucasian female who presents with history of large-volume hematochezia. She had a colonoscopy by her primary care physician and found to have internal hemorrhoids. She subsequently noted to have heme positive stools. She complains of irregular bowel movements. She has very painful defecation. She has history of IBS with constipation but since she had gallbladder surgery 2 years ago she started having diarrhea. She has good appetite and denies weight loss. Family history significant for colon carcinoma in maternal grandmother who was 60 at the time of diagnosis and died 10 years later following bowel surgery details unknown.  Past Medical History  Diagnosis Date  . IBS (irritable bowel syndrome)   . Hypertension   . Endometriosis   . Kidney stones   . Anxiety     Past Surgical History  Procedure Laterality Date  . Cholecystectomy    . Laparoscopic abdominal exploration      looking for endometriosis    Family History  Problem Relation Age of Onset  . Hypertension Father   . Other Other    Social History:  reports that she has been smoking Cigarettes.  She has a 6 pack-year smoking history. She has never used smokeless tobacco. She reports that she does not drink alcohol or use illicit drugs.  Allergies:  Allergies  Allergen Reactions  . Penicillins Shortness Of Breath    Medications Prior to Admission  Medication Sig Dispense Refill  . ALPRAZolam (XANAX) 1 MG tablet Take 1 mg by mouth 3 (three) times daily as needed for anxiety.       Marland Kitchen ibuprofen (ADVIL,MOTRIN) 200 MG tablet Take 400 mg by mouth every 6 (six) hours as needed for pain.      . nebivolol (BYSTOLIC) 2.5 MG tablet Take 2.5 mg by mouth daily.      . ondansetron (ZOFRAN) 4 MG tablet Take 1 tablet (4 mg total) by mouth every 8 (eight) hours as needed for nausea.  60 tablet  0  . oxyCODONE-acetaminophen  (PERCOCET/ROXICET) 5-325 MG per tablet Take 1 tablet by mouth every 8 (eight) hours as needed for pain. Takes one to two days per month with period      . promethazine (PHENERGAN) 25 MG tablet Take 25 mg by mouth every 6 (six) hours as needed for nausea.        No results found for this or any previous visit (from the past 48 hour(s)). No results found.  ROS  Blood pressure 133/93, pulse 79, temperature 98.4 F (36.9 C), temperature source Oral, resp. rate 14, height 5\' 6"  (1.676 m), weight 175 lb (79.379 kg), last menstrual period 10/06/2012, SpO2 99.00%. Physical Exam  Constitutional: She appears well-developed and well-nourished.  HENT:  Mouth/Throat: Oropharynx is clear and moist.  Eyes: Conjunctivae are normal. No scleral icterus.  Neck: No thyromegaly present.  Cardiovascular: Normal rate, regular rhythm and normal heart sounds.   No murmur heard. Respiratory: Effort normal and breath sounds normal.  GI: Soft. She exhibits no distension and no mass. There is no tenderness.  Musculoskeletal: She exhibits no edema.  Lymphadenopathy:    She has no cervical adenopathy.  Neurological: She is alert.  Skin: Skin is warm.     Assessment/Plan Hematochezia. Painful defecation. Family history of colon carcinoma in a second-degree relative. Diagnostic colonoscopy.  REHMAN,NAJEEB U 10/31/2012, 12:18 PM

## 2012-11-01 ENCOUNTER — Encounter (HOSPITAL_COMMUNITY): Payer: Self-pay | Admitting: Internal Medicine

## 2012-11-06 ENCOUNTER — Telehealth: Payer: Self-pay | Admitting: Family Medicine

## 2012-11-06 ENCOUNTER — Encounter (HOSPITAL_COMMUNITY): Payer: Self-pay

## 2012-11-06 ENCOUNTER — Telehealth (INDEPENDENT_AMBULATORY_CARE_PROVIDER_SITE_OTHER): Payer: Self-pay | Admitting: *Deleted

## 2012-11-06 NOTE — Telephone Encounter (Signed)
Patient was evaluated at a hospital. Patient underwent colonoscopy last week. She received Demerol, Versed and promethazine. She has developed phlebitis she had discomfort starting from her wrist extending to elbow in linear fashion. There is slight erythema to skin but no breakdown. Prescription given for Levaquin 500 mg by mouth daily for 7 days and Medrol Dosepak. Patient advised to call with progress report in 48 hours

## 2012-11-06 NOTE — Telephone Encounter (Signed)
Lupita Leash please disregard Dr.Rehman is having the patient to go to [facility] and they will assess it and have them let him know.

## 2012-11-06 NOTE — Telephone Encounter (Signed)
Kelly Salazar per Dr.Rehman , the patient needs to come in today for [place] to look at her hand and rule out the possible need for antibiotics.

## 2012-11-06 NOTE — Telephone Encounter (Signed)
See previous telephone call from today.

## 2012-11-06 NOTE — Telephone Encounter (Signed)
Patient called and made aware.

## 2012-11-06 NOTE — Telephone Encounter (Signed)
This is being forwarded to Dr.Rehman

## 2012-11-06 NOTE — Op Note (Signed)
Patient came in to Short stay area on 11/06/2012 per Dr. Cathie Beams order with complaints of swollen, bruised  right hand from IV that was placed and removed on 10/31/2012. Dr. Karilyn Cota assesed IV site and gave patient prescriptions. Patient instructed to follow up on Friday if area not better.

## 2012-11-06 NOTE — Telephone Encounter (Signed)
Kelly Salazar had a TCS last week and the IV cite on her hand is still swollen. It is soar with a knot and very concerned about it. Her return phone number is [phone].

## 2012-11-06 NOTE — Telephone Encounter (Signed)
Pt offered 3 appts and then decided that she would just go to urgent care instead , b/c of her busy schedule

## 2013-02-04 ENCOUNTER — Ambulatory Visit (INDEPENDENT_AMBULATORY_CARE_PROVIDER_SITE_OTHER): Payer: BC Managed Care – PPO | Admitting: Internal Medicine

## 2013-02-06 ENCOUNTER — Other Ambulatory Visit (HOSPITAL_COMMUNITY): Payer: Self-pay | Admitting: Obstetrics & Gynecology

## 2013-02-06 DIAGNOSIS — R102 Pelvic and perineal pain: Secondary | ICD-10-CM

## 2013-02-07 ENCOUNTER — Encounter (HOSPITAL_COMMUNITY): Payer: Self-pay

## 2013-02-07 ENCOUNTER — Telehealth (INDEPENDENT_AMBULATORY_CARE_PROVIDER_SITE_OTHER): Payer: Self-pay | Admitting: *Deleted

## 2013-02-07 ENCOUNTER — Ambulatory Visit (HOSPITAL_COMMUNITY)
Admission: RE | Admit: 2013-02-07 | Discharge: 2013-02-07 | Disposition: A | Discharge status: Home / Self Care | Payer: BC Managed Care – PPO | Source: Ambulatory Visit | Attending: Obstetrics & Gynecology | Admitting: Obstetrics & Gynecology

## 2013-02-07 ENCOUNTER — Encounter: Payer: Self-pay | Admitting: Family Medicine

## 2013-02-07 ENCOUNTER — Other Ambulatory Visit (HOSPITAL_COMMUNITY): Payer: Self-pay | Admitting: Obstetrics & Gynecology

## 2013-02-07 ENCOUNTER — Ambulatory Visit (INDEPENDENT_AMBULATORY_CARE_PROVIDER_SITE_OTHER): Payer: BC Managed Care – PPO | Admitting: Family Medicine

## 2013-02-07 VITALS — BP 133/96 | HR 88 | Temp 98.4°F | Wt 180.0 lb

## 2013-02-07 DIAGNOSIS — N200 Calculus of kidney: Secondary | ICD-10-CM | POA: Insufficient documentation

## 2013-02-07 DIAGNOSIS — G8929 Other chronic pain: Secondary | ICD-10-CM

## 2013-02-07 DIAGNOSIS — R1031 Right lower quadrant pain: Secondary | ICD-10-CM

## 2013-02-07 DIAGNOSIS — K589 Irritable bowel syndrome without diarrhea: Secondary | ICD-10-CM | POA: Insufficient documentation

## 2013-02-07 DIAGNOSIS — K449 Diaphragmatic hernia without obstruction or gangrene: Secondary | ICD-10-CM | POA: Insufficient documentation

## 2013-02-07 DIAGNOSIS — R112 Nausea with vomiting, unspecified: Secondary | ICD-10-CM | POA: Insufficient documentation

## 2013-02-07 DIAGNOSIS — N209 Urinary calculus, unspecified: Secondary | ICD-10-CM | POA: Insufficient documentation

## 2013-02-07 DIAGNOSIS — N949 Unspecified condition associated with female genital organs and menstrual cycle: Secondary | ICD-10-CM | POA: Insufficient documentation

## 2013-02-07 DIAGNOSIS — R102 Pelvic and perineal pain: Secondary | ICD-10-CM

## 2013-02-07 DIAGNOSIS — J9819 Other pulmonary collapse: Secondary | ICD-10-CM | POA: Insufficient documentation

## 2013-02-07 MED ORDER — FLUCONAZOLE 150 MG PO TABS: 150.0000 mg | ORAL_TABLET | Freq: Once | ORAL | Status: DC

## 2013-02-07 MED ORDER — IOHEXOL 300 MG/ML  SOLN
100.0000 mL | Freq: Once | INTRAMUSCULAR | Status: AC | PRN
  Administered 2013-02-07: 100 mL via INTRAVENOUS

## 2013-02-07 MED ORDER — LEVOFLOXACIN 500 MG PO TABS: 500.0000 mg | ORAL_TABLET | Freq: Every day | ORAL | Status: DC

## 2013-02-07 MED ORDER — METRONIDAZOLE 500 MG PO TABS: 500.0000 mg | ORAL_TABLET | Freq: Three times a day (TID) | ORAL | Status: DC

## 2013-02-07 NOTE — Progress Notes (Signed)
   Subjective:    Patient ID: Kelly Salazar, female    DOB: 03-23-1970, 42 y.o.   MRN: [id number]  HPI This 42 y.o. female presents for evaluation of lower abdominal pelvic pain.  She Was seen by her OBGYN for abdominal pelvic pain today and underwent a CT of the Abdomen and pelvis.  She has the CT report in her hand.  She wants a cbc she states Because she is worried about having appendicitis.  She has been having right pelvic pain And she had an [place] which showed a cyst and she states her OBGYN ordered a CT scan Of the pelvis and abdomen.  The results of the CT show diverticulitis, ovarian cysts, And no visualization of the appendix..   Review of Systems C/o abdominal pain. No chest pain, SOB, HA, dizziness, vision change, N/V, diarrhea, constipation, dysuria, urinary urgency or frequency, myalgias, arthralgias or rash.     Objective:   Physical Exam Vital signs noted  Well developed well nourished female.  HEENT - Head atraumatic Normocephalic                Eyes - PERRLA, Conjuctiva - clear Sclera- Clear EOMI                Ears - EAC's Wnl TM's Wnl Gross Hearing WNL                Nose - Nares patent                 Throat - oropharanx wnl Respiratory - Lungs CTA bilateral Cardiac - RRR S1 and S2 without murmur GI - Abdomen tender in LLQ, RLQ, and periumbilical, and bowel sounds active x 4. No guarding or rebound tenderness. Extremities - No edema. Neuro - Grossly intact.  Results for orders placed in visit on 10/02/12  POCT CBC      Result Value Range   WBC 6.9  4.6 - 10.2 K/uL   Lymph, poc 1.8  0.6 - 3.4   POC LYMPH PERCENT 25.7  10 - 50 %L   POC Granulocyte 4.8  2 - 6.9   Granulocyte percent 69.0  37 - 80 %G   RBC 4.9  4.04 - 5.48 M/uL   Hemoglobin 14.2  12.2 - 16.2 g/dL   HCT, POC 40.9  81.1 - 47.9 %   MCV 88.8  80 - 97 fL   MCH, POC 28.9  27 - 31.2 pg   MCHC 32.5  31.8 - 35.4 g/dL   RDW, POC 91.4     Platelet Count, POC 218.0  142 - 424 K/uL   MPV 8.5  0 -  99.8 fL       Assessment & Plan:  Abdominal pain, chronic, right lower quadrant - Plan: levofloxacin (LEVAQUIN) 500 MG tablet, metroNIDAZOLE (FLAGYL) 500 MG tablet, fluconazole (DIFLUCAN) 150 MG tablet, CANCELED: CBC With differential/Platelet  Bilateral adenexal cysts - Follow up with OBGYN.  Diverticulitis - She is currently on levaquin and will add flagyl and extend levaquin.  Discussed she should continue to follow up with OBGYN and get the exp lap surgery for her  Pelvic pain.  Deatra Canter FNP

## 2013-02-07 NOTE — Telephone Encounter (Addendum)
Kelly Salazar had a CT done on 02/07/13 at [facility]. She is having right side pain. If Dr. Karilyn Cota would please look at the report and return her call at [phone].  The report is in EPIC.

## 2013-02-07 NOTE — Telephone Encounter (Signed)
Loda would like to speak with Dr. Karilyn Cota today if at all possible. She is to have surgery tomorrow and had questions to ask.

## 2013-02-07 NOTE — Telephone Encounter (Signed)
CT films reviewed and patient's call returned. CT findings do not correspond to her symptoms. She has focal inflammatory changes which could be epiploicae appendigitis or focal colitis it would not explain her right-sided pain. She can proceed with diagnostic laparoscopy and will call us if she needs office visit.

## 2013-02-07 NOTE — Telephone Encounter (Signed)
Forwarded to Dr.Rehman to review, patient's next visit is 04/2013 with you. Procedure was done in August, 11-01-12. Then on 11-06-12, she had complication form her IV. Having bad pain in side, [facility]), she is requesting that you review them,(EPIC).

## 2013-02-07 NOTE — Patient Instructions (Signed)
Diverticulitis °A diverticulum is a small pouch or sac on the colon. Diverticulosis is the presence of these diverticula on the colon. Diverticulitis is the irritation (inflammation) or infection of diverticula. °CAUSES  °The colon and its diverticula contain bacteria. If food particles block the tiny opening to a diverticulum, the bacteria inside can grow and cause an increase in pressure. This leads to infection and inflammation and is called diverticulitis. °SYMPTOMS  °· Abdominal pain and tenderness. Usually, the pain is located on the left side of your abdomen. However, it could be located elsewhere. °· Fever. °· Bloating. °· Feeling sick to your stomach (nausea). °· Throwing up (vomiting). °· Abnormal stools. °DIAGNOSIS  °Your caregiver will take a history and perform a physical exam. Since many things can cause abdominal pain, other tests may be necessary. Tests may include: °· Blood tests. °· Urine tests. °· X-ray of the abdomen. °· CT scan of the abdomen. °Sometimes, surgery is needed to determine if diverticulitis or other conditions are causing your symptoms. °TREATMENT  °Most of the time, you can be treated without surgery. Treatment includes: °· Resting the bowels by only having liquids for a few days. As you improve, you will need to eat a low-fiber diet. °· Intravenous (IV) fluids if you are losing body fluids (dehydrated). °· Antibiotic medicines that treat infections may be given. °· Pain and nausea medicine, if needed. °· Surgery if the inflamed diverticulum has burst. °HOME CARE INSTRUCTIONS  °· Try a clear liquid diet (broth, tea, or water for as long as directed by your caregiver). You may then gradually begin a low-fiber diet as tolerated.  °A low-fiber diet is a diet with less than 10 grams of fiber. Choose the foods below to reduce fiber in the diet: °· White breads, cereals, rice, and pasta. °· Cooked fruits and vegetables or soft fresh fruits and vegetables without the skin. °· Ground or  well-cooked tender beef, ham, veal, lamb, pork, or poultry. °· Eggs and seafood. °· After your diverticulitis symptoms have improved, your caregiver may put you on a high-fiber diet. A high-fiber diet includes 14 grams of fiber for every 1000 calories consumed. For a standard 2000 calorie diet, you would need 28 grams of fiber. Follow these diet guidelines to help you increase the fiber in your diet. It is important to slowly increase the amount fiber in your diet to avoid gas, constipation, and bloating. °· Choose whole-grain breads, cereals, pasta, and brown rice. °· Choose fresh fruits and vegetables with the skin on. Do not overcook vegetables because the more vegetables are cooked, the more fiber is lost. °· Choose more nuts, seeds, legumes, dried peas, beans, and lentils. °· Look for food products that have greater than 3 grams of fiber per serving on the Nutrition Facts label. °· Take all medicine as directed by your caregiver. °· If your caregiver has given you a follow-up appointment, it is very important that you go. Not going could result in lasting (chronic) or permanent injury, pain, and disability. If there is any problem keeping the appointment, call to reschedule. °SEEK MEDICAL CARE IF:  °· Your pain does not improve. °· You have a hard time advancing your diet beyond clear liquids. °· Your bowel movements do not return to normal. °SEEK IMMEDIATE MEDICAL CARE IF:  °· Your pain becomes worse. °· You have an oral temperature above 102° F (38.9° C), not controlled by medicine. °· You have repeated vomiting. °· You have bloody or black, tarry stools. °·   Symptoms that brought you to your caregiver become worse or are not getting better. °MAKE SURE YOU:  °· Understand these instructions. °· Will watch your condition. °· Will get help right away if you are not doing well or get worse. °Document Released: 12/01/2004 Document Revised: 05/16/2011 Document Reviewed: 03/29/2010 °Exit[employer]® Patient Information  ©2014 [employer]. ° °

## 2013-04-22 ENCOUNTER — Ambulatory Visit (INDEPENDENT_AMBULATORY_CARE_PROVIDER_SITE_OTHER): Payer: BC Managed Care – PPO | Admitting: Internal Medicine

## 2013-06-26 ENCOUNTER — Telehealth: Payer: Self-pay | Admitting: Family Medicine

## 2013-06-26 NOTE — Telephone Encounter (Signed)
Patient aware that it will be a month before I can get her in. That she would have to be seen here before we can refill her medications since she has not been seen here except for sick visits.

## 2013-07-15 ENCOUNTER — Telehealth: Payer: Self-pay | Admitting: Family Medicine

## 2013-07-19 NOTE — Telephone Encounter (Signed)
Unable to reach patient by phone 

## 2013-08-21 ENCOUNTER — Ambulatory Visit (INDEPENDENT_AMBULATORY_CARE_PROVIDER_SITE_OTHER): Payer: BC Managed Care – PPO | Admitting: Physician Assistant

## 2013-08-21 ENCOUNTER — Encounter: Payer: Self-pay | Admitting: Physician Assistant

## 2013-08-21 VITALS — BP 142/98 | HR 90 | Temp 98.7°F | Ht 66.0 in | Wt 180.6 lb

## 2013-08-21 DIAGNOSIS — I10 Essential (primary) hypertension: Secondary | ICD-10-CM | POA: Insufficient documentation

## 2013-08-21 DIAGNOSIS — F411 Generalized anxiety disorder: Secondary | ICD-10-CM | POA: Insufficient documentation

## 2013-08-21 MED ORDER — NEBIVOLOL HCL 2.5 MG PO TABS: 2.5000 mg | ORAL_TABLET | Freq: Every day | ORAL | Status: DC

## 2013-08-21 MED ORDER — ALPRAZOLAM 1 MG PO TABS: 1.0000 mg | ORAL_TABLET | Freq: Two times a day (BID) | ORAL | Status: DC | PRN

## 2013-08-21 NOTE — Progress Notes (Signed)
Subjective:     Patient ID: Kelly Salazar, female   DOB: [birth year], 43 y.o.   MRN: [id number]  HPI Pt here to establish care Prev a pt through a practice in [place] She has moved to [place] and is working at [employer] Hx of HTN and anxiety States she has been on Xanax for sx for ~ 6692yrs and has not has attack since being on this med On her last visit they recommended she f/u with a MD in [place] Pt states prev on SSRI and Wellbutrin but unable to tolerate due to SE She has weaned herself to bid dosing  Review of Systems  Constitutional: Negative for activity change and appetite change.  HENT: Negative.   Respiratory: Negative for choking, chest tightness and shortness of breath.   Cardiovascular: Negative for chest pain, palpitations and leg swelling.       Objective:   Physical Exam  Vitals reviewed. Constitutional: She appears well-developed and well-nourished.  HENT:  Head: Normocephalic and atraumatic.  Neck: Neck supple. No JVD present. No thyromegaly present.  Cardiovascular: Normal rate, regular rhythm, normal heart sounds and intact distal pulses.   Pulmonary/Chest: Effort normal and breath sounds normal.  Lymphadenopathy:    She has no cervical adenopathy.  Skin: Skin is warm and dry.  Psychiatric: She has a normal mood and affect. Her behavior is normal. Judgment and thought content normal.       Assessment:     HTN Anxiety    Plan:     Rf of her Bystolic done today Informed BP is up slightly Will have her keep a check on this  Aware if it remains elevated her dosage may need to be increased Also reviewed current guidelines of practice and controlled subst. Rx of 1 month only of Xanax done today Keep regular f/u appts F/U in 6 month for BP with labs

## 2013-08-21 NOTE — Patient Instructions (Signed)

## 2013-09-17 ENCOUNTER — Other Ambulatory Visit: Payer: Self-pay | Admitting: Family Medicine

## 2013-09-17 ENCOUNTER — Other Ambulatory Visit: Payer: Self-pay | Admitting: *Deleted

## 2013-09-17 NOTE — Telephone Encounter (Signed)
Last ov 08/21/13 with Montey HoraBill Webster.  Last refill 08/22/13. [facility] [phone]. Pt has no scheduled upcoming appt.

## 2013-09-19 ENCOUNTER — Telehealth: Payer: Self-pay | Admitting: Physician Assistant

## 2013-09-20 MED ORDER — ALPRAZOLAM 1 MG PO TABS: 1.0000 mg | ORAL_TABLET | Freq: Two times a day (BID) | ORAL | Status: DC | PRN

## 2013-09-20 NOTE — Telephone Encounter (Signed)
Please call in xanax 1 mg 1 po Bid #60 with 1 refills

## 2013-09-20 NOTE — Telephone Encounter (Signed)
Called in patient aware.  

## 2013-09-20 NOTE — Telephone Encounter (Signed)
Called in.

## 2013-09-20 NOTE — Telephone Encounter (Signed)
Filled and seen by [facility] on 08/21/13. Uses [facility] in [place] [phone]

## 2013-09-30 ENCOUNTER — Telehealth: Payer: Self-pay | Admitting: Family Medicine

## 2013-10-03 NOTE — Telephone Encounter (Signed)
Unable to reach patient by phone 

## 2013-11-20 ENCOUNTER — Other Ambulatory Visit: Payer: Self-pay

## 2013-11-20 MED ORDER — ALPRAZOLAM 1 MG PO TABS: 1.0000 mg | ORAL_TABLET | Freq: Two times a day (BID) | ORAL | Status: DC | PRN

## 2013-11-20 NOTE — Telephone Encounter (Signed)
Please call in xanax with 0 refills no more refills without being seen  

## 2013-11-20 NOTE — Telephone Encounter (Signed)
Last seen 08/21/13  [facility]  IF approved route to nurse to call into [facility] [phone]

## 2013-11-20 NOTE — Telephone Encounter (Signed)
[facility], stated need OV

## 2013-12-23 ENCOUNTER — Telehealth: Payer: Self-pay | Admitting: Family Medicine

## 2013-12-23 ENCOUNTER — Telehealth: Payer: Self-pay | Admitting: Nurse Practitioner

## 2013-12-23 MED ORDER — ALPRAZOLAM 1 MG PO TABS: 1.0000 mg | ORAL_TABLET | Freq: Two times a day (BID) | ORAL | Status: DC | PRN

## 2013-12-23 NOTE — Telephone Encounter (Signed)
Please call in xanax 1mg 1 po BID #60  with 1 refills 

## 2013-12-23 NOTE — Telephone Encounter (Signed)
What pharmacy? Original message says [facility] but this isn't one of her pharmacies on file so I wasn't sure which location. Left message for patient to return call to switchboard with this information.

## 2013-12-24 ENCOUNTER — Telehealth: Payer: Self-pay | Admitting: Family Medicine

## 2013-12-24 NOTE — Telephone Encounter (Signed)
[facility] per mmm

## 2014-02-20 ENCOUNTER — Other Ambulatory Visit: Payer: Self-pay

## 2014-02-21 ENCOUNTER — Other Ambulatory Visit: Payer: Self-pay | Admitting: Nurse Practitioner

## 2014-02-21 ENCOUNTER — Telehealth: Payer: Self-pay | Admitting: Family Medicine

## 2014-02-21 MED ORDER — ALPRAZOLAM 1 MG PO TABS: 1.0000 mg | ORAL_TABLET | Freq: Two times a day (BID) | ORAL | Status: DC | PRN

## 2014-02-21 NOTE — Telephone Encounter (Signed)
Error - wrong provider

## 2014-02-21 NOTE — Telephone Encounter (Signed)
Please call in xanax 1mg po BID #60  with 0 refills 

## 2014-02-24 NOTE — Telephone Encounter (Signed)
[facility] in [place].

## 2014-04-02 ENCOUNTER — Other Ambulatory Visit: Payer: Self-pay | Admitting: *Deleted

## 2014-04-02 MED ORDER — ALPRAZOLAM 1 MG PO TABS: 1.0000 mg | ORAL_TABLET | Freq: Two times a day (BID) | ORAL | Status: AC | PRN

## 2014-04-02 NOTE — Telephone Encounter (Signed)
Please call in xanax with 0 refills no more refills without being seen  

## 2014-04-02 NOTE — Telephone Encounter (Signed)
rx called into pharmacy

## 2014-04-02 NOTE — Telephone Encounter (Signed)
Pt requesting refill on her xanax 1 mg take 1 tab PO 1-2x a day Prn for anxiety, last seen 08/2013, no appt scheduled, last filled 02/24/14, if approved please route to pools for [facility] [place] [phone].

## 2016-09-06 ENCOUNTER — Ambulatory Visit: Payer: Self-pay | Admitting: Family Medicine

## 2017-10-16 ENCOUNTER — Other Ambulatory Visit: Payer: Self-pay

## 2017-10-16 ENCOUNTER — Emergency Department (HOSPITAL_COMMUNITY): Payer: BLUE CROSS/BLUE SHIELD

## 2017-10-16 ENCOUNTER — Observation Stay (HOSPITAL_BASED_OUTPATIENT_CLINIC_OR_DEPARTMENT_OTHER): Payer: BLUE CROSS/BLUE SHIELD

## 2017-10-16 ENCOUNTER — Observation Stay (HOSPITAL_COMMUNITY)
Admission: EM | Admit: 2017-10-16 | Discharge: 2017-10-17 | Disposition: A | Discharge status: Home / Self Care | Payer: BLUE CROSS/BLUE SHIELD | Attending: Family Medicine | Admitting: Family Medicine

## 2017-10-16 ENCOUNTER — Encounter (HOSPITAL_COMMUNITY): Payer: Self-pay | Admitting: Emergency Medicine

## 2017-10-16 DIAGNOSIS — R002 Palpitations: Secondary | ICD-10-CM | POA: Insufficient documentation

## 2017-10-16 DIAGNOSIS — R0602 Shortness of breath: Secondary | ICD-10-CM | POA: Insufficient documentation

## 2017-10-16 DIAGNOSIS — F419 Anxiety disorder, unspecified: Secondary | ICD-10-CM | POA: Diagnosis not present

## 2017-10-16 DIAGNOSIS — K219 Gastro-esophageal reflux disease without esophagitis: Secondary | ICD-10-CM | POA: Diagnosis not present

## 2017-10-16 DIAGNOSIS — R110 Nausea: Secondary | ICD-10-CM | POA: Diagnosis not present

## 2017-10-16 DIAGNOSIS — F17210 Nicotine dependence, cigarettes, uncomplicated: Secondary | ICD-10-CM | POA: Insufficient documentation

## 2017-10-16 DIAGNOSIS — Z720 Tobacco use: Secondary | ICD-10-CM

## 2017-10-16 DIAGNOSIS — I493 Ventricular premature depolarization: Secondary | ICD-10-CM | POA: Diagnosis not present

## 2017-10-16 DIAGNOSIS — Z7982 Long term (current) use of aspirin: Secondary | ICD-10-CM | POA: Insufficient documentation

## 2017-10-16 DIAGNOSIS — R0789 Other chest pain: Secondary | ICD-10-CM | POA: Diagnosis not present

## 2017-10-16 DIAGNOSIS — Z880 Allergy status to penicillin: Secondary | ICD-10-CM | POA: Diagnosis not present

## 2017-10-16 DIAGNOSIS — I361 Nonrheumatic tricuspid (valve) insufficiency: Secondary | ICD-10-CM

## 2017-10-16 DIAGNOSIS — F410 Panic disorder [episodic paroxysmal anxiety] without agoraphobia: Secondary | ICD-10-CM | POA: Diagnosis not present

## 2017-10-16 DIAGNOSIS — R9431 Abnormal electrocardiogram [ECG] [EKG]: Secondary | ICD-10-CM | POA: Diagnosis not present

## 2017-10-16 DIAGNOSIS — I10 Essential (primary) hypertension: Secondary | ICD-10-CM | POA: Insufficient documentation

## 2017-10-16 DIAGNOSIS — R079 Chest pain, unspecified: Secondary | ICD-10-CM | POA: Diagnosis present

## 2017-10-16 DIAGNOSIS — K589 Irritable bowel syndrome without diarrhea: Secondary | ICD-10-CM | POA: Diagnosis not present

## 2017-10-16 LAB — BASIC METABOLIC PANEL
Anion gap: 12 (ref 5–15)
BUN: 10 mg/dL (ref 6–20)
CO2: 27 mmol/L (ref 22–32)
Calcium: 9.2 mg/dL (ref 8.9–10.3)
Chloride: 103 mmol/L (ref 98–111)
Creatinine, Ser: 0.68 mg/dL (ref 0.44–1.00)
GFR calc Af Amer: >60 mL/min (ref >60)
GFR calc non Af Amer: >60 mL/min (ref >60)
Glucose, Bld: 80 mg/dL (ref 70–99)
Potassium: 3.3 mmol/L — ABNORMAL LOW (ref 3.5–5.1)
Sodium: 142 mmol/L (ref 135–145)

## 2017-10-16 LAB — RAPID URINE DRUG SCREEN, HOSP PERFORMED
Amphetamines: POSITIVE — AB
Barbiturates: NOT DETECTED
Benzodiazepines: POSITIVE — AB
Cocaine: NOT DETECTED
Opiates: NOT DETECTED
Tetrahydrocannabinol: NOT DETECTED

## 2017-10-16 LAB — CBC
HCT: 40.2 % (ref 36.0–46.0)
Hemoglobin: 14.2 g/dL (ref 12.0–15.0)
MCH: 31.1 pg (ref 26.0–34.0)
MCHC: 35.3 g/dL (ref 30.0–36.0)
MCV: 88.2 fL (ref 78.0–100.0)
Platelets: 220 10*3/uL (ref 150–400)
RBC: 4.56 MIL/uL (ref 3.87–5.11)
RDW: 13.1 % (ref 11.5–15.5)
WBC: 3.4 10*3/uL — ABNORMAL LOW (ref 4.0–10.5)

## 2017-10-16 LAB — ECHOCARDIOGRAM COMPLETE
Height: 66 in
Weight: 2400 oz

## 2017-10-16 LAB — I-STAT BETA HCG BLOOD, ED (MC, WL, AP ONLY): I-stat hCG, quantitative: <5 m[IU]/mL (ref <5)

## 2017-10-16 LAB — D-DIMER, QUANTITATIVE: D-Dimer, Quant: <0.27 ug/mL-FEU (ref 0.00–0.50)

## 2017-10-16 LAB — TROPONIN I: Troponin I: <0.03 ng/mL (ref <0.03)

## 2017-10-16 LAB — I-STAT TROPONIN, ED: Troponin i, poc: 0 ng/mL (ref 0.00–0.08)

## 2017-10-16 MED ORDER — MECLIZINE HCL 25 MG PO TABS
25.0000 mg | ORAL_TABLET | Freq: Once | ORAL | Status: AC
  Administered 2017-10-16: 25 mg via ORAL
  Filled 2017-10-16: qty 1

## 2017-10-16 MED ORDER — ONDANSETRON HCL 4 MG PO TABS: 4.0000 mg | ORAL_TABLET | Freq: Four times a day (QID) | ORAL | Status: DC | PRN

## 2017-10-16 MED ORDER — ENOXAPARIN SODIUM 40 MG/0.4ML ~~LOC~~ SOLN
40.0000 mg | SUBCUTANEOUS | Status: DC
  Administered 2017-10-16: 40 mg via SUBCUTANEOUS
  Filled 2017-10-16 (×2): qty 0.4

## 2017-10-16 MED ORDER — ONDANSETRON HCL 4 MG/2ML IJ SOLN
4.0000 mg | Freq: Four times a day (QID) | INTRAMUSCULAR | Status: DC | PRN
  Administered 2017-10-16: 4 mg via INTRAVENOUS
  Filled 2017-10-16: qty 2

## 2017-10-16 MED ORDER — ACETAMINOPHEN 325 MG PO TABS: 650.0000 mg | ORAL_TABLET | Freq: Four times a day (QID) | ORAL | Status: DC | PRN

## 2017-10-16 MED ORDER — ASPIRIN EC 81 MG PO TBEC
81.0000 mg | DELAYED_RELEASE_TABLET | Freq: Every day | ORAL | Status: DC
  Filled 2017-10-16: qty 1

## 2017-10-16 MED ORDER — POTASSIUM CHLORIDE CRYS ER 20 MEQ PO TBCR
40.0000 meq | EXTENDED_RELEASE_TABLET | ORAL | Status: AC
  Administered 2017-10-16 (×2): 40 meq via ORAL
  Filled 2017-10-16 (×2): qty 2

## 2017-10-16 MED ORDER — LORAZEPAM 2 MG/ML IJ SOLN
0.5000 mg | Freq: Once | INTRAMUSCULAR | Status: AC
  Administered 2017-10-16: 0.5 mg via INTRAVENOUS
  Filled 2017-10-16: qty 1

## 2017-10-16 MED ORDER — ALPRAZOLAM 0.5 MG PO TABS
0.5000 mg | ORAL_TABLET | Freq: Four times a day (QID) | ORAL | Status: DC | PRN
  Administered 2017-10-16 – 2017-10-17 (×3): 0.5 mg via ORAL
  Filled 2017-10-16 (×3): qty 1

## 2017-10-16 MED ORDER — TRAMADOL HCL 50 MG PO TABS
50.0000 mg | ORAL_TABLET | Freq: Four times a day (QID) | ORAL | Status: DC | PRN
  Administered 2017-10-17: 50 mg via ORAL
  Filled 2017-10-16 (×2): qty 1

## 2017-10-16 MED ORDER — ACETAMINOPHEN 650 MG RE SUPP: 650.0000 mg | Freq: Four times a day (QID) | RECTAL | Status: DC | PRN

## 2017-10-16 MED ORDER — SODIUM CHLORIDE 0.9 % IV BOLUS
1000.0000 mL | Freq: Once | INTRAVENOUS | Status: AC
  Administered 2017-10-16: 1000 mL via INTRAVENOUS

## 2017-10-16 MED ORDER — ASPIRIN 81 MG PO CHEW
324.0000 mg | CHEWABLE_TABLET | Freq: Once | ORAL | Status: AC
  Administered 2017-10-16: 324 mg via ORAL
  Filled 2017-10-16: qty 4

## 2017-10-16 MED ORDER — FAMOTIDINE 20 MG PO TABS
20.0000 mg | ORAL_TABLET | Freq: Every day | ORAL | Status: DC
  Administered 2017-10-16: 20 mg via ORAL
  Filled 2017-10-16 (×2): qty 1

## 2017-10-16 NOTE — Progress Notes (Signed)
  Echocardiogram 2D Echocardiogram has been performed.  Shona SimpsonLane, Sheldon Amara F 10/16/2017, 4:32 PM

## 2017-10-16 NOTE — Progress Notes (Signed)
Pt complaining of sharp left arm pain, teary and stating this is different pain from before.  BP elevated at 156/103, HR 76, 12 lead EKG done and was unremarkable.  MD notified, orders received, will continue to monitor.

## 2017-10-16 NOTE — ED Triage Notes (Signed)
Pt c/o chest pains that radiate to left arm since last night. Reports took zantac last night due to thinking it was heart burn related. Pt reports that she took xanax as well but neither has helped.

## 2017-10-16 NOTE — ED Notes (Signed)
[facility].

## 2017-10-16 NOTE — ED Provider Notes (Signed)
[facility]-EMERGENCY DEPT Provider Note   CSN: [id number] Arrival date & time: 10/16/17  1036     History   Chief Complaint Chief Complaint  Patient presents with  . Chest Pain    HPI Kelly Salazar is a 47 y.o. female.  Who presents the emergency department chief complaint of palpitations and chest pain.  Patient has a past medical history of anxiety and panic attacks, reflux, esophageal spasm, reflux and von willibebrands.  Patient states that last night she began having palpitations.  She took half of a Xanax and also some Zantac without relief.  She states that the palpitations lasted overnight and woke from sleep several times.  This morning she had significant difficulty getting ready for work because she felt extremely tired and somewhat dizzy and lightheaded.  She began having pain on the left side of her chest radiating into her left neck and left arm.  She states that she was feeling extremely anxious about this because she has had panic attacks in the past and this is not at all similar to her previous episodes.  She denies unilateral leg swelling, hemoptysis, cough, previous history of PE or DVT.  She is a daily smoker.  She has a history of hypertension but denies hypercholesterolemia or early MI in her family.  The patient states that she did take a Sudafed yesterday morning but denies use of stimulant medications including amphetamines or cocaine.     Past Medical History:  Diagnosis Date  . Anxiety   . Endometriosis   . Hypertension   . IBS (irritable bowel syndrome)   . Kidney stones     Patient Active Problem List   Diagnosis Date Noted  . HTN (hypertension) 08/21/2013  . Anxiety state, unspecified 08/21/2013  . Rectal bleeding 10/08/2012  . Nausea 10/08/2012  . IBS (irritable bowel syndrome) 10/02/2012  . Endometriosis 10/02/2012    Past Surgical History:  Procedure Laterality Date  . CHOLECYSTECTOMY    . COLONOSCOPY N/A 10/31/2012   Procedure: COLONOSCOPY;  Surgeon: Malissa Hippo, MD;  Location: [facility];  Service: Endoscopy;  Laterality: N/A;  105-moved to 1220 Ann to notify pt  . LAPAROSCOPIC ABDOMINAL EXPLORATION     looking for endometriosis     OB History   None      Home Medications    Prior to Admission medications   Medication Sig Start Date End Date Taking? Authorizing Provider  ALPRAZolam Prudy Feeler) 1 MG tablet Take 1 tablet (1 mg total) by mouth 2 (two) times daily as needed for anxiety. 04/02/14   Daphine Deutscher, Mary-Margaret, FNP  dicyclomine (BENTYL) 10 MG capsule Take 1 capsule (10 mg total) by mouth 2 (two) times daily before a meal. 10/31/12   Rehman, Joline Maxcy, MD  ibuprofen (ADVIL,MOTRIN) 200 MG tablet Take 400 mg by mouth every 6 (six) hours as needed for pain.    [provider]  nebivolol (BYSTOLIC) 2.5 MG tablet Take 1 tablet (2.5 mg total) by mouth daily. 08/21/13   Inis Sizer, PA-C  oxyCODONE-acetaminophen (PERCOCET/ROXICET) 5-325 MG per tablet Take 1 tablet by mouth every 8 (eight) hours as needed for pain. Takes one to two days per month with period    [provider]  promethazine (PHENERGAN) 25 MG tablet Take 25 mg by mouth every 6 (six) hours as needed for nausea.    [provider]  psyllium (METAMUCIL SMOOTH TEXTURE) 28 % packet Take 1 packet by mouth daily. 10/31/12  Malissa Hippoehman, Najeeb U, MD    Family History Family History  Problem Relation Age of Onset  . Hypertension Father   . Other Other     Social History Social History   Tobacco Use  . Smoking status: Current Every Day Smoker    Packs/day: 1.00    Years: 6.00    Pack years: 6.00    Types: Cigarettes  . Smokeless tobacco: Never Used  . Tobacco comment: 1/2-1pack a day x5 yrs  Substance Use Topics  . Alcohol use: No  . Drug use: No     Allergies   Penicillin g; Penicillins; and Shellfish allergy   Review of Systems Review of Systems Ten systems reviewed and are negative for acute  change, except as noted in the HPI.    Physical Exam Updated Vital Signs BP (!) 126/101 (BP Location: Right Arm)   Pulse 89   Temp 97.7 F (36.5 C) (Oral)   Resp 20   Ht 5\' 6"  (1.676 m)   Wt 68 kg   LMP 05/24/2017   SpO2 97%   BMI 24.21 kg/m   Physical Exam  Constitutional: She is oriented to person, place, and time. She appears well-developed and well-nourished. No distress.  HENT:  Head: Normocephalic and atraumatic.  Eyes: Conjunctivae are normal. No scleral icterus.  Neck: Normal range of motion.  Cardiovascular: Normal rate, regular rhythm and normal heart sounds. Exam reveals no gallop and no friction rub.  No murmur heard. Pulmonary/Chest: Effort normal and breath sounds normal. No respiratory distress.  Abdominal: Soft. Bowel sounds are normal. She exhibits no distension and no mass. There is no tenderness. There is no guarding.  Musculoskeletal:       Right lower leg: She exhibits no tenderness and no edema.       Left lower leg: She exhibits no tenderness and no edema.  Neurological: She is alert and oriented to person, place, and time.  Skin: Skin is warm and dry. She is not diaphoretic.  Psychiatric: Her behavior is normal.  Nursing note and vitals reviewed.    ED Treatments / Results  Labs (all labs ordered are listed, but only abnormal results are displayed) Labs Reviewed  BASIC METABOLIC PANEL  CBC  RAPID URINE DRUG SCREEN, HOSP PERFORMED  D-DIMER, QUANTITATIVE (NOT AT [facility])  I-STAT TROPONIN, ED  I-STAT BETA HCG BLOOD, ED ([facility], [facility], [facility] ONLY)    EKG EKG Interpretation  Date/Time:  Monday October 16 2017 10:41:49 EDT Ventricular Rate:  86 PR Interval:    QRS Duration: 92 QT Interval:  359 QTC Calculation: 430 R Axis:   -5 Text Interpretation:  Sinus rhythm Atrial premature complex Nonspecific T abnormalities, diffuse leads Since last EKG, there are diffuse inferolateral TWI and ST depressions Confirmed by Shaune PollackIsaacs, Cameron [phone]([id number]) on 10/16/2017  10:45:00 AM   Radiology Dg Chest 2 View  Result Date: 10/16/2017 CLINICAL DATA:  Chest pain, sob, left arm pain, and rapid heart beat started this morning; smoker; medicated htn EXAM: CHEST - 2 VIEW COMPARISON:  none FINDINGS: Lungs are clear. Heart size and mediastinal contours are within normal limits. No effusion.  No pneumothorax. Visualized bones unremarkable.  Cholecystectomy clips. IMPRESSION: No acute cardiopulmonary disease. Electronically Signed   By: Corlis Leak  Hassell M.D.   On: 10/16/2017 11:08    Procedures Procedures (including critical care time)  Medications Ordered in ED Medications  aspirin chewable tablet 324 mg (has no administration in time range)  sodium chloride 0.9 % bolus 1,000 mL (has no  administration in time range)  meclizine (ANTIVERT) tablet 25 mg (has no administration in time range)  LORazepam (ATIVAN) injection 0.5 mg (has no administration in time range)     Initial Impression / Assessment and Plan / ED Course  I have reviewed the triage vital signs and the nursing notes.  Pertinent labs & imaging results that were available during my care of the patient were reviewed by me and considered in my medical decision making (see chart for details).  Clinical Course as of Oct 16 1256  Mon Oct 16, 2017  1256 Amphetamines(!): POSITIVE [AH]    Clinical Course User Index [AH] Arthor CaptainHarris, Tiffnay Bossi, PA-C   47 year old female with history of hypertension and smoking here with abnormal EKG and chest pain. The emergent differential diagnosis of chest pain includes: Acute coronary syndrome, pericarditis, aortic dissection, pulmonary embolism, tension pneumothorax, pneumonia, and esophageal rupture. Patient is notably hypertensive here in the emergency department also very anxious.  I reviewed her PA and lateral chest film which shows no widened mediastinum, pneumonia or pneumothorax.  Is otherwise unremarkable. Her EKG shows ST segment depression in the inferolateral leads.   Her troponin is negative here in the emergency department.  Patient has a moderate risk heart score with concerning symptoms.  She denies any amphetamine use but does state that she took Sudafed.  I am unsure if this could potentially give her false positive for amphetamines on her UDS.  Final Clinical Impressions(s) / ED Diagnoses   Final diagnoses:  Chest pain with moderate risk of acute coronary syndrome    ED Discharge Orders    None       Arthor CaptainHarris, Rowene Suto, PA-C 10/16/17 1609    Shaune PollackIsaacs, Cameron, MD 10/17/17 [phone]

## 2017-10-16 NOTE — H&P (Signed)
History and Physical    Kelly Salazar VWU:[id number] DOB: 1970/10/11 DOA: 10/16/2017  PCP: Johna Sheriff, MD   Patient coming from: Home   Chief Complaint: Chest pain  HPI: Kelly Salazar is a 47 y.o. female with medical history significant for anxiety with panic attacks, IBS, who presented to the ED with complaints of palpitations that started last night, unprovoked, while she was resting, and wheezing/pressure-like chest pains radiating to her left jaw and arm, that started this morning when she woke up from bed.  Chest pain is associated with shortness of breath diaphoresis and nausea.  She took Xanax and Zantac without relief. Pain was persistent till she got to the ED, improved after IV Ativan was given.  Father had cardiac stents placed in his 26s. Patient traveled to the [place] came back 2 days ago.  Denies leg swelling redness or pain.  Patient smokes cigarettes half a pack to a pack a day.  Occasional alcohol use.  Denies IV drug use.  She reports she used to have hyperventilation with palpitations over 10 years ago related to her panic attacks, but none since she has been taking Xanax 0.5 mg 4 times daily, and as needed Zantac.  ED Course: Blood pressure systolic 120s to 295A otherwise stable.  Troponin x1-neg,  K mildly low 3.3.  UDS positive for amphetamines and benzo.  D-dimer negative.  EKG shows mild ST depression 2 3 aVF.  No old EKG to compare.  Hospitalist was called to admit for chest pain rule out ACS.  Review of Systems: As per HPI all other systems reviewed and negative  Past Medical History:  Diagnosis Date  . Anxiety   . Endometriosis   . Hypertension   . IBS (irritable bowel syndrome)   . Kidney stones     Past Surgical History:  Procedure Laterality Date  . CHOLECYSTECTOMY    . COLONOSCOPY N/A 10/31/2012   Procedure: COLONOSCOPY;  Surgeon: Malissa Hippo, MD;  Location: [facility];  Service: Endoscopy;  Laterality: N/A;  105-moved to 1220 Ann to notify pt    . LAPAROSCOPIC ABDOMINAL EXPLORATION     looking for endometriosis     reports that she has been smoking cigarettes. She has a 6.00 pack-year smoking history. She has never used smokeless tobacco. She reports that she does not drink alcohol or use drugs.  Allergies  Allergen Reactions  . Penicillin G Anaphylaxis    Has patient had a PCN reaction causing immediate rash, facial/tongue/throat swelling, SOB or lightheadedness with hypotension: No Has patient had a PCN reaction causing severe rash involving mucus membranes or skin necrosis: No Has patient had a PCN reaction that required hospitalization: Yes Has patient had a PCN reaction occurring within the last 10 years: No If all of the above answers are "NO", then may proceed with Cephalosporin use.   Marland Kitchen Penicillins Shortness Of Breath    Has patient had a PCN reaction causing immediate rash, facial/tongue/throat swelling, SOB or lightheadedness with hypotension: Yes Has patient had a PCN reaction causing severe rash involving mucus membranes or skin necrosis: No Has patient had a PCN reaction that required hospitalization: Yes Has patient had a PCN reaction occurring within the last 10 years: No If all of the above answers are "NO", then may proceed with Cephalosporin use.   . Shellfish Allergy Anaphylaxis  . Amoxil [Amoxicillin]     Has patient had a PCN reaction causing immediate rash, facial/tongue/throat swelling, SOB or lightheadedness with hypotension:  Yes Has patient had a PCN reaction causing severe rash involving mucus membranes or skin necrosis: No Has patient had a PCN reaction that required hospitalization: No Has patient had a PCN reaction occurring within the last 10 years: Yes If all of the above answers are "NO", then may proceed with Cephalosporin use.   Marland Kitchen. Doxycycline Other (See Comments)    Heart palpitations    Family History  Problem Relation Age of Onset  . Hypertension Father   . Other Other     Prior  to Admission medications   Medication Sig Start Date End Date Taking? Authorizing Provider  ALPRAZolam Prudy Feeler(XANAX) 1 MG tablet Take 1 tablet (1 mg total) by mouth 2 (two) times daily as needed for anxiety. 04/02/14  Yes Daphine DeutscherMartin, Mary-Margaret, FNP  alum & mag hydroxide-simeth (MAALOX/MYLANTA) 200-200-20 MG/5ML suspension Take 15 mLs by mouth as needed for indigestion or heartburn.   Yes [provider]  Ascorbic Acid (VITAMIN C PO) Take 1 packet by mouth daily.   Yes [provider]  aspirin EC 81 MG tablet Take 81 mg by mouth daily as needed (chest pain).   Yes [provider]  BYSTOLIC 5 MG tablet Take 2.5 mg by mouth daily. 10/04/17  Yes [provider]  Cholecalciferol (VITAMIN D PO) Take 1 tablet by mouth daily.   Yes [provider]  Cyanocobalamin (VITAMIN B 12 PO) Take 1 tablet by mouth daily.   Yes [provider]  ibuprofen (ADVIL,MOTRIN) 200 MG tablet Take 400 mg by mouth every 6 (six) hours as needed for pain.   Yes [provider]  naproxen sodium (ALEVE) 220 MG tablet Take 220 mg by mouth daily as needed (headache).   Yes [provider]  ranitidine (ZANTAC) 150 MG tablet Take 150 mg by mouth daily as needed for heartburn (indigestion).   Yes [provider]  dicyclomine (BENTYL) 10 MG capsule Take 1 capsule (10 mg total) by mouth 2 (two) times daily before a meal. Patient not taking: Reported on 10/16/2017 10/31/12   Malissa Hippoehman, Najeeb U, MD  nebivolol (BYSTOLIC) 2.5 MG tablet Take 1 tablet (2.5 mg total) by mouth daily. Patient not taking: Reported on 10/16/2017 08/21/13   Inis SizerWebster, William L, PA-C  psyllium (METAMUCIL SMOOTH TEXTURE) 28 % packet Take 1 packet by mouth daily. Patient not taking: Reported on 10/16/2017 10/31/12   Malissa Hippoehman, Najeeb U, MD    Physical Exam: Vitals:   10/16/17 1044 10/16/17 1045 10/16/17 1150 10/16/17 1403  BP: (!) 126/101  (!) 142/89 138/83  Pulse: 89  80 77  Resp: 20  (!) 22 18  Temp:  97.7 F (36.5 C)   98.5 F (36.9 C)  TempSrc: Oral   Oral  SpO2: 97%  99% 99%  Weight:  68 kg    Height:  5\' 6"  (1.676 m)      Constitutional: NAD, calm, comfortable Vitals:   10/16/17 1044 10/16/17 1045 10/16/17 1150 10/16/17 1403  BP: (!) 126/101  (!) 142/89 138/83  Pulse: 89  80 77  Resp: 20  (!) 22 18  Temp: 97.7 F (36.5 C)   98.5 F (36.9 C)  TempSrc: Oral   Oral  SpO2: 97%  99% 99%  Weight:  68 kg    Height:  5\' 6"  (1.676 m)     Eyes: PERRL, lids and conjunctivae normal ENMT: Mucous membranes are moist. Posterior pharynx clear of any exudate or lesions.  Neck: normal, supple, no masses, no thyromegaly Respiratory: clear to  auscultation bilaterally, no wheezing, no crackles. Normal respiratory effort. No accessory muscle use.  Cardiovascular: Regular rate and rhythm, no murmurs / rubs / gallops. No extremity edema. 2+ pedal pulses.  Abdomen: no tenderness, no masses palpated. No hepatosplenomegaly. Bowel sounds positive.  Musculoskeletal: no clubbing / cyanosis. No joint deformity upper and lower extremities. Good ROM, no contractures. Normal muscle tone.  Skin: no rashes, lesions, ulcers. No induration Neurologic: CN 2-12 grossly intact. Sensation intact, DTR normal. Strength 5/5 in all 4.  Psychiatric: Normal judgment and insight. Alert and oriented x 3. Normal mood.   Labs on Admission: I have personally reviewed following labs and imaging studies  CBC: Recent Labs  Lab 10/16/17 1127  WBC 3.4*  HGB 14.2  HCT 40.2  MCV 88.2  PLT 220   Basic Metabolic Panel: Recent Labs  Lab 10/16/17 1127  NA 142  K 3.3*  CL 103  CO2 27  GLUCOSE 80  BUN 10  CREATININE 0.68  CALCIUM 9.2    Radiological Exams on Admission: Dg Chest 2 View  Result Date: 10/16/2017 CLINICAL DATA:  Chest pain, sob, left arm pain, and rapid heart beat started this morning; smoker; medicated htn EXAM: CHEST - 2 VIEW COMPARISON:  none FINDINGS: Lungs are clear. Heart size and mediastinal  contours are within normal limits. No effusion.  No pneumothorax. Visualized bones unremarkable.  Cholecystectomy clips. IMPRESSION: No acute cardiopulmonary disease. Electronically Signed   By: Corlis Leak  Hassell M.D.   On: 10/16/2017 11:08    EKG: Independently reviewed.  Mild ST depression inferior leads.  Normal intervals, sinus rhythm.  Assessment/Plan Active Problems:   Chest pain   Chest pain-not exactly typical.  Troponin negative x1.  EKG with inferior lead mild depressions, and T wave inversions.  Similar on repeat. HEART score- 3.  Risk factors smoker, HTN.  UDS positive for amphetamines.  Recent trip, but negative d-dimer. -Considering EKG findings consulted cardiology, talked to Dr. Rennis GoldenHilty, EKG findings likely repolarization abnormalities, likely stress test, will see patient tomorrow. -Tropes x 2. -EKG a.m. -Echocardiogram -Aspirin 325 given in ED -Lipid panel a.m. -N.p.o. Midnight  Anxiety/panic attack history-  -Continue home Xanax 0.5 4 times daily  HTN-stable - Hold home Bystolic at this time, in case of exercise stress test.  HIV as part of routine health screening  DVT prophylaxis: Lovenox Code Status: Full Family Communication: None at bedside Disposition Plan: Per rounding team Consults called: Cardiology Admission status: Obs, tele   Onnie BoerEjiroghene E Lipa Knauff MD [facility] Pager [phone] From 7AM- 7PM.  Otherwise please contact night-coverage [email or web address] Password [email or web address]  10/16/2017, 2:43 PM

## 2017-10-16 NOTE — ED Notes (Signed)
ED TO INPATIENT HANDOFF REPORT  Name/Age/Gender Kelly Salazar 47 y.o. female  Code Status   Home/SNF/Other Home  Chief Complaint chest pain,skipping beat  Level of Care/Admitting Diagnosis ED Disposition    ED Disposition Condition [facility] Area: [place] [[id number]]  Level of Care: Telemetry [5]  Admit to tele based on following criteria: Other see comments  Comments: chest pain  Diagnosis: Chest pain [707615]  Admitting Physician: Bethena Roys [[id number]]  Attending Physician: Bethena Roys [phone]  PT Class (Do Not Modify): Observation [104]  PT Acc Code (Do Not Modify): Observation [10022]       Medical History Past Medical History:  Diagnosis Date  . Anxiety   . Endometriosis   . Hypertension   . IBS (irritable bowel syndrome)   . Kidney stones     Allergies Allergies  Allergen Reactions  . Penicillin G Anaphylaxis    Has patient had a PCN reaction causing immediate rash, facial/tongue/throat swelling, SOB or lightheadedness with hypotension: No Has patient had a PCN reaction causing severe rash involving mucus membranes or skin necrosis: No Has patient had a PCN reaction that required hospitalization: Yes Has patient had a PCN reaction occurring within the last 10 years: No If all of the above answers are "NO", then may proceed with Cephalosporin use.   Marland Kitchen Penicillins Shortness Of Breath    Has patient had a PCN reaction causing immediate rash, facial/tongue/throat swelling, SOB or lightheadedness with hypotension: Yes Has patient had a PCN reaction causing severe rash involving mucus membranes or skin necrosis: No Has patient had a PCN reaction that required hospitalization: Yes Has patient had a PCN reaction occurring within the last 10 years: No If all of the above answers are "NO", then may proceed with Cephalosporin use.   . Shellfish Allergy Anaphylaxis  . Amoxil [Amoxicillin]     Has patient had a  PCN reaction causing immediate rash, facial/tongue/throat swelling, SOB or lightheadedness with hypotension: Yes Has patient had a PCN reaction causing severe rash involving mucus membranes or skin necrosis: No Has patient had a PCN reaction that required hospitalization: No Has patient had a PCN reaction occurring within the last 10 years: Yes If all of the above answers are "NO", then may proceed with Cephalosporin use.   Marland Kitchen Doxycycline Other (See Comments)    Heart palpitations    IV Location/Drains/Wounds Patient Lines/Drains/Airways Status   Active Line/Drains/Airways    Name:   Placement date:   Placement time:   Site:   Days:   Peripheral IV 10/16/17 Right Antecubital   10/16/17    1125    Antecubital   less than 1          Labs/Imaging Results for orders placed or performed during the hospital encounter of 10/16/17 (from the past 48 hour(s))  Basic metabolic panel     Status: Abnormal   Collection Time: 10/16/17 11:27 AM  Result Value Ref Range   Sodium 142 135 - 145 mmol/L   Potassium 3.3 (L) 3.5 - 5.1 mmol/L   Chloride 103 98 - 111 mmol/L   CO2 27 22 - 32 mmol/L   Glucose, Bld 80 70 - 99 mg/dL   BUN 10 6 - 20 mg/dL   Creatinine, Ser 0.68 0.44 - 1.00 mg/dL   Calcium 9.2 8.9 - 10.3 mg/dL   GFR calc non Af Amer >60 >60 mL/min   GFR calc Af Amer >60 >60 mL/min  Comment: (NOTE) The eGFR has been calculated using the CKD EPI equation. This calculation has not been validated in all clinical situations. eGFR's persistently <60 mL/min signify possible Chronic Kidney Disease.    Anion gap 12 5 - 15    Comment: Performed at [facility], [place] [address], [place], [place] [address]  CBC     Status: Abnormal   Collection Time: 10/16/17 11:27 AM  Result Value Ref Range   WBC 3.4 (L) 4.0 - 10.5 K/uL   RBC 4.56 3.87 - 5.11 MIL/uL   Hemoglobin 14.2 12.0 - 15.0 g/dL   HCT 40.2 36.0 - 46.0 %   MCV 88.2 78.0 - 100.0 fL   MCH 31.1 26.0 - 34.0 pg   MCHC 35.3  30.0 - 36.0 g/dL   RDW 13.1 11.5 - 15.5 %   Platelets 220 150 - 400 K/uL    Comment: Performed at [facility], [place] [address] [place], [place] [address]  Rapid urine drug screen (hospital performed)     Status: Abnormal   Collection Time: 10/16/17 11:27 AM  Result Value Ref Range   Opiates NONE DETECTED NONE DETECTED   Cocaine NONE DETECTED NONE DETECTED   Benzodiazepines POSITIVE (A) NONE DETECTED   Amphetamines POSITIVE (A) NONE DETECTED   Tetrahydrocannabinol NONE DETECTED NONE DETECTED   Barbiturates NONE DETECTED NONE DETECTED    Comment: (NOTE) DRUG SCREEN FOR MEDICAL PURPOSES ONLY.  IF CONFIRMATION IS NEEDED FOR ANY PURPOSE, NOTIFY LAB WITHIN 5 DAYS. LOWEST DETECTABLE LIMITS FOR URINE DRUG SCREEN Drug Class                     Cutoff (ng/mL) Amphetamine and metabolites    1000 Barbiturate and metabolites    200 Benzodiazepine                 619 Tricyclics and metabolites     300 Opiates and metabolites        300 Cocaine and metabolites        300 THC                            50 Performed at [facility], [place] [address] [place], [place] [address]   D-dimer, quantitative (not at [facility])     Status: None   Collection Time: 10/16/17 11:27 AM  Result Value Ref Range   D-Dimer, Quant <0.27 0.00 - 0.50 ug/mL-FEU    Comment: (NOTE) At the manufacturer cut-off of 0.50 ug/mL FEU, this assay has been documented to exclude PE with a sensitivity and negative predictive value of 97 to 99%.  At this time, this assay has not been approved by the FDA to exclude DVT/VTE. Results should be correlated with clinical presentation. Performed at [facility], [place] [address] [place], [place] [address]   I-stat troponin, ED     Status: None   Collection Time: 10/16/17 11:33 AM  Result Value Ref Range   Troponin i, poc 0.00 0.00 - 0.08 ng/mL   Comment 3            Comment: Due to the release kinetics of cTnI, a negative  result within the first hours of the onset of symptoms does not rule out myocardial infarction with certainty. If myocardial infarction is still suspected, repeat the test at appropriate intervals.   I-Stat beta hCG blood, ED     Status: None  Collection Time: 10/16/17 11:44 AM  Result Value Ref Range   I-stat hCG, quantitative <5.0 <5 mIU/mL   Comment 3            Comment:   GEST. AGE      CONC.  (mIU/mL)   <=1 WEEK        5 - 50     2 WEEKS       50 - 500     3 WEEKS       100 - 10,000     4 WEEKS     1,000 - 30,000        FEMALE AND NON-PREGNANT FEMALE:     LESS THAN 5 mIU/mL    Dg Chest 2 View  Result Date: 10/16/2017 CLINICAL DATA:  Chest pain, sob, left arm pain, and rapid heart beat started this morning; smoker; medicated htn EXAM: CHEST - 2 VIEW COMPARISON:  none FINDINGS: Lungs are clear. Heart size and mediastinal contours are within normal limits. No effusion.  No pneumothorax. Visualized bones unremarkable.  Cholecystectomy clips. IMPRESSION: No acute cardiopulmonary disease. Electronically Signed   By: Lucrezia Europe M.D.   On: 10/16/2017 11:08    Pending Labs Unresulted Labs (From admission, onward)    Start     Ordered   10/16/17 1700  Troponin I (q 6hr x 3)  Now then every 6 hours,   R     10/16/17 1453   10/16/17 1453  Troponin I  Add-on,   R     10/16/17 1453   Signed and Held  HIV antibody (Routine Testing)  Once,   R     Signed and Held          Vitals/Pain Today's Vitals   10/16/17 1150 10/16/17 1403 10/16/17 1528 10/16/17 1531  BP: (!) 142/89 138/83  (!) 136/93  Pulse: 80 77  80  Resp: (!) 22 18  20   Temp:  98.5 F (36.9 C)    TempSrc:  Oral    SpO2: 99% 99%  100%  Weight:      Height:      PainSc:   4      Isolation Precautions No active isolations  Medications Medications  aspirin chewable tablet 324 mg (324 mg Oral Given 10/16/17 1139)  sodium chloride 0.9 % bolus 1,000 mL (0 mLs Intravenous Stopped 10/16/17 1358)  meclizine (ANTIVERT)  tablet 25 mg (25 mg Oral Given 10/16/17 1139)  LORazepam (ATIVAN) injection 0.5 mg (0.5 mg Intravenous Given 10/16/17 1139)    Mobility walks

## 2017-10-17 ENCOUNTER — Observation Stay (HOSPITAL_COMMUNITY): Payer: BLUE CROSS/BLUE SHIELD

## 2017-10-17 ENCOUNTER — Encounter (HOSPITAL_COMMUNITY): Payer: Self-pay | Admitting: Cardiology

## 2017-10-17 DIAGNOSIS — Z720 Tobacco use: Secondary | ICD-10-CM | POA: Diagnosis not present

## 2017-10-17 DIAGNOSIS — I10 Essential (primary) hypertension: Secondary | ICD-10-CM | POA: Diagnosis not present

## 2017-10-17 DIAGNOSIS — R9431 Abnormal electrocardiogram [ECG] [EKG]: Secondary | ICD-10-CM | POA: Diagnosis not present

## 2017-10-17 DIAGNOSIS — R0789 Other chest pain: Secondary | ICD-10-CM | POA: Diagnosis not present

## 2017-10-17 DIAGNOSIS — F17210 Nicotine dependence, cigarettes, uncomplicated: Secondary | ICD-10-CM | POA: Diagnosis not present

## 2017-10-17 DIAGNOSIS — R079 Chest pain, unspecified: Secondary | ICD-10-CM | POA: Diagnosis not present

## 2017-10-17 DIAGNOSIS — F419 Anxiety disorder, unspecified: Secondary | ICD-10-CM | POA: Diagnosis not present

## 2017-10-17 LAB — BASIC METABOLIC PANEL WITH GFR
Anion gap: 7 (ref 5–15)
BUN: 8 mg/dL (ref 6–20)
CO2: 28 mmol/L (ref 22–32)
Calcium: 8.3 mg/dL — ABNORMAL LOW (ref 8.9–10.3)
Chloride: 105 mmol/L (ref 98–111)
Creatinine, Ser: 0.63 mg/dL (ref 0.44–1.00)
GFR calc Af Amer: >60 mL/min
GFR calc non Af Amer: >60 mL/min
Glucose, Bld: 85 mg/dL (ref 70–99)
Potassium: 4.3 mmol/L (ref 3.5–5.1)
Sodium: 140 mmol/L (ref 135–145)

## 2017-10-17 LAB — TROPONIN I: Troponin I: <0.03 ng/mL

## 2017-10-17 LAB — LIPID PANEL
Cholesterol: 149 mg/dL (ref 0–200)
HDL: 38 mg/dL — ABNORMAL LOW
LDL Cholesterol: 95 mg/dL (ref 0–99)
Total CHOL/HDL Ratio: 3.9 ratio
Triglycerides: 82 mg/dL
VLDL: 16 mg/dL (ref 0–40)

## 2017-10-17 LAB — HIV ANTIBODY (ROUTINE TESTING W REFLEX): HIV Screen 4th Generation wRfx: NONREACTIVE

## 2017-10-17 MED ORDER — ROSUVASTATIN CALCIUM 10 MG PO TABS: 10.0000 mg | ORAL_TABLET | Freq: Every day | ORAL | Status: DC

## 2017-10-17 MED ORDER — NITROGLYCERIN 0.4 MG SL SUBL
SUBLINGUAL_TABLET | SUBLINGUAL | Status: AC
  Filled 2017-10-17: qty 2

## 2017-10-17 MED ORDER — IOPAMIDOL (ISOVUE-370) INJECTION 76%
100.0000 mL | Freq: Once | INTRAVENOUS | Status: AC | PRN
  Administered 2017-10-17: 100 mL via INTRAVENOUS

## 2017-10-17 MED ORDER — ROSUVASTATIN CALCIUM 10 MG PO TABS: 10.0000 mg | ORAL_TABLET | Freq: Every day | ORAL | 0 refills | Status: DC

## 2017-10-17 MED ORDER — METOPROLOL TARTRATE 50 MG PO TABS
50.0000 mg | ORAL_TABLET | Freq: Once | ORAL | Status: AC
  Administered 2017-10-17: 50 mg via ORAL
  Filled 2017-10-17: qty 1

## 2017-10-17 NOTE — Care Management Note (Signed)
Case Management Note  Patient Details  Name: Kelly Salazar MRN: [id number] Date of Birth: [birth year]  Subjective/Objective:  Admitted wchest pain. From home-recent move from [place]. No pcp in [place]-provided w/pcp listing-[facility]-patient will make own appt). No further CM needs.                  Action/Plan:d/c home.   Expected Discharge Date:  (unknown)               Expected Discharge Plan:  Home/Self Care  In-House Referral:     Discharge planning Services  Other - See comment(PCP-resource)  Post Acute Care Choice:    Choice offered to:     DME Arranged:    DME Agency:     HH Arranged:    HH Agency:     Status of Service:     If discussed at Long Length of Stay Meetings, dates discussed:    Additional Comments:  Kelly Salazar, Kelly Hansell, RN 10/17/2017, 11:44 AM

## 2017-10-17 NOTE — Consult Note (Signed)
Cardiology Consultation:   Patient ID: Kelly Salazar; [id number]; 1970-07-17   Admit date: 10/16/2017 Date of Consult: 10/17/2017  Primary Care Provider: Johna Sheriff, MD Primary Cardiologist: Chilton Si, MD - New  Patient Profile:   Kelly Salazar is a 47 y.o. female with a hx of HTN, anxiety with panic attacks, IBS, tobacco use who is being seen today for the evaluation of pain at the request of Dr Mariea Clonts.  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 She was recently married and just returned from her Honeymoon in the [place] on Friday. She has history of hypertension and was on diovan for years until it stopped working. She was switched to Bystolic for BP and also for her long time elevated heart rate and PVC's. She takes a small dose and her BP was been well controlled. She was evaluated over 10 years ago for PVCs with normal echo and stress test. She also has a history of panic attacks and is on Xanax QID and has not had a panic attack in many years on this regimen. She smokes 10-15 cigarettes per day and drinks only occasional alcohol.   While on her [place] trip on Thursday she began to feel very weak and fatigued with feeling of her heart skipping beats. She thought she may have been dehydrated out in the heat so she went inside to cool off and drank power ade and electrolyte water. With further questioning she admitted that she had taken a Pamabron for menstual cramps which is an over the counter diuretic. She also takes pseudophed daily for sinus headaches and has a prescription for Phentermine for wt loss which she rarely takes but she took a half a dose last week.    She felt mostly better until Sunday when she felt very fatigued and felt an irregular heart beat. On Sunday night she felt her heart "thumping" so much that she could not sleep. On Monday morning she took a Xanax and her bystolic hoping that it would help but she continued to feel weak and then  developed upper chest pressure that radiated to her neck, left shoulder and left arm. She also felt short of breath and was sweaty. This lasted for several hours until resolved at the ED after a dose of ativan. She says that these symptoms are not at all like her panic attacks. She still has a mild constant chest pressure and feels like her heart is beating fast, although we are watching the monitor and her rates are in the 60's.   She says that she is not interested in an outpatient monitor because she already knows that she has PVC's and does not feel that the monitor will provide any useful information. She also does not want a stress test to evaluate her chest pain. Her husband is asking about a cardiac CTA.  Troponins negative x3, d-dimer was negative, chest x-ray showed no acute cardiopulmonary disease EKG: Sinus rhythm, 86 bpm, with diffuse nonspecific T wave abnormalities Lipid panel: TC 149, LDL 95, HDL 38, triglycerides 82 Echocardiogram: Normal LV systolic function, EF 55-60%, no valvular disease K+ 3.3 on admission   Past Medical History:  Diagnosis Date  . Anxiety   . Endometriosis   . Hypertension   . IBS (irritable bowel syndrome)   . Kidney stones     Past Surgical History:  Procedure Laterality Date  . CHOLECYSTECTOMY    . COLONOSCOPY N/A 10/31/2012   Procedure: COLONOSCOPY;  Surgeon: Malissa HippoNajeeb U Rehman, MD;  Location: [facility];  Service: Endoscopy;  Laterality: N/A;  105-moved to 1220 Ann to notify pt  . LAPAROSCOPIC ABDOMINAL EXPLORATION     looking for endometriosis     Home Medications:  Prior to Admission medications   Medication Sig Start Date End Date Taking? Authorizing Provider  ALPRAZolam Prudy Feeler(XANAX) 1 MG tablet Take 1 tablet (1 mg total) by mouth 2 (two) times daily as needed for anxiety. 04/02/14  Yes Daphine DeutscherMartin, Mary-Margaret, FNP  alum & mag hydroxide-simeth (MAALOX/MYLANTA) 200-200-20 MG/5ML suspension Take 15 mLs by mouth as needed for indigestion or  heartburn.   Yes [provider]  Ascorbic Acid (VITAMIN C PO) Take 1 packet by mouth daily.   Yes [provider]  aspirin EC 81 MG tablet Take 81 mg by mouth daily as needed (chest pain).   Yes [provider]  BYSTOLIC 5 MG tablet Take 2.5 mg by mouth daily. 10/04/17  Yes [provider]  Cholecalciferol (VITAMIN D PO) Take 1 tablet by mouth daily.   Yes [provider]  Cyanocobalamin (VITAMIN B 12 PO) Take 1 tablet by mouth daily.   Yes [provider]  ibuprofen (ADVIL,MOTRIN) 200 MG tablet Take 400 mg by mouth every 6 (six) hours as needed for pain.   Yes [provider]  naproxen sodium (ALEVE) 220 MG tablet Take 220 mg by mouth daily as needed (headache).   Yes [provider]  ranitidine (ZANTAC) 150 MG tablet Take 150 mg by mouth daily as needed for heartburn (indigestion).   Yes [provider]  dicyclomine (BENTYL) 10 MG capsule Take 1 capsule (10 mg total) by mouth 2 (two) times daily before a meal. Patient not taking: Reported on 10/16/2017 10/31/12   Malissa Hippoehman, Najeeb U, MD  nebivolol (BYSTOLIC) 2.5 MG tablet Take 1 tablet (2.5 mg total) by mouth daily. Patient not taking: Reported on 10/16/2017 08/21/13   Inis SizerWebster, William L, PA-C  psyllium (METAMUCIL SMOOTH TEXTURE) 28 % packet Take 1 packet by mouth daily. Patient not taking: Reported on 10/16/2017 10/31/12   Malissa Hippoehman, Najeeb U, MD    Inpatient Medications: Scheduled Meds: . aspirin EC  81 mg Oral Daily  . enoxaparin (LOVENOX) injection  40 mg Subcutaneous Q24H  . famotidine  20 mg Oral Daily   Continuous Infusions:  PRN Meds: acetaminophen **OR** acetaminophen, ALPRAZolam, ondansetron **OR** ondansetron (ZOFRAN) IV, traMADol  Allergies:    Allergies  Allergen Reactions  . Penicillin G Anaphylaxis    Has patient had a PCN reaction causing immediate rash, facial/tongue/throat swelling, SOB or lightheadedness with hypotension: No Has patient had a  PCN reaction causing severe rash involving mucus membranes or skin necrosis: No Has patient had a PCN reaction that required hospitalization: Yes Has patient had a PCN reaction occurring within the last 10 years: No If all of the above answers are "NO", then may proceed with Cephalosporin use.   Marland Kitchen. Penicillins Shortness Of Breath    Has patient had a PCN reaction causing immediate rash, facial/tongue/throat swelling, SOB or lightheadedness with hypotension: Yes Has patient had a PCN reaction causing severe rash involving mucus membranes or skin necrosis: No Has patient had a PCN reaction that required hospitalization: Yes Has patient had a PCN reaction occurring within the last 10 years: No If all of the above answers are "NO", then may proceed with Cephalosporin use.   . Shellfish Allergy Anaphylaxis  . Amoxil [Amoxicillin]     Has patient had a  PCN reaction causing immediate rash, facial/tongue/throat swelling, SOB or lightheadedness with hypotension: Yes Has patient had a PCN reaction causing severe rash involving mucus membranes or skin necrosis: No Has patient had a PCN reaction that required hospitalization: No Has patient had a PCN reaction occurring within the last 10 years: Yes If all of the above answers are "NO", then may proceed with Cephalosporin use.   Marland Kitchen. Doxycycline Other (See Comments)    Heart palpitations    Social History:   Social History   Socioeconomic History  . Marital status: Divorced    Spouse name: Not on file  . Number of children: Not on file  . Years of education: Not on file  . Highest education level: Not on file  Occupational History  . Not on file  Social Needs  . Financial resource strain: Not on file  . Food insecurity:    Worry: Not on file    Inability: Not on file  . Transportation needs:    Medical: Not on file    Non-medical: Not on file  Tobacco Use  . Smoking status: Current Every Day Smoker    Packs/day: 1.00    Years: 6.00     Pack years: 6.00    Types: Cigarettes  . Smokeless tobacco: Never Used  . Tobacco comment: 1/2-1pack a day x5 yrs  Substance and Sexual Activity  . Alcohol use: No  . Drug use: No  . Sexual activity: Not on file  Lifestyle  . Physical activity:    Days per week: Not on file    Minutes per session: Not on file  . Stress: Not on file  Relationships  . Social connections:    Talks on phone: Not on file    Gets together: Not on file    Attends religious service: Not on file    Active member of club or organization: Not on file    Attends meetings of clubs or organizations: Not on file    Relationship status: Not on file  . Intimate partner violence:    Fear of current or ex partner: Not on file    Emotionally abused: Not on file    Physically abused: Not on file    Forced sexual activity: Not on file  Other Topics Concern  . Not on file  Social History Narrative  . Not on file    Family History:    Family History  Problem Relation Age of Onset  . Hypertension Father   . Other Other      ROS:  Please see the history of present illness. All other ROS reviewed and negative.     Physical Exam/Data:   Vitals:   10/16/17 1545 10/16/17 1714 10/16/17 2214 10/17/17 0518  BP:  (!) 152/103 (!) 150/95 135/80  Pulse:  78 75 71  Resp:  18    Temp:   98.5 F (36.9 C) 98 F (36.7 C)  TempSrc:   Oral Oral  SpO2:   99% 99%  Weight: 68 kg     Height: 5\' 6"  (1.676 m)       Intake/Output Summary (Last 24 hours) at 10/17/2017 0725 Last data filed at 10/16/2017 2230 Gross per 24 hour  Intake 720 ml  Output -  Net 720 ml   Filed Weights   10/16/17 1045 10/16/17 1545  Weight: 68 kg 68 kg   Body mass index is 24.21 kg/m.  General:  Well nourished, well developed, in no acute distress HEENT: normal  Lymph: no adenopathy Neck: no JVD Endocrine:  No thryomegaly Vascular: No carotid bruits; FA pulses 2+ bilaterally without bruits  Cardiac:  normal S1, S2; RRR; no murmur    Lungs:  clear to auscultation bilaterally, no wheezing, rhonchi or rales  Abd: soft, nontender, no hepatomegaly  Ext: no edema Musculoskeletal:  No deformities, BUE and BLE strength normal and equal Skin: warm and dry  Neuro:  CNs 2-12 intact, no focal abnormalities noted Psych:  Normal affect   EKG:  The EKG was personally reviewed and demonstrates:  Sinus rhythm, with diffuse nonspecific T wave abnormalities, no acute ischemia, no evolution Telemetry:  Telemetry was personally reviewed and demonstrates:  Sinus rhythm in the 60's-70's  Relevant CV Studies:  Echocardiogram 10/16/17 Study Conclusions - Left ventricle: The cavity size was normal. Wall thickness was   normal. Systolic function was normal. The estimated ejection   fraction was in the range of 55% to 60%.   Laboratory Data:  Chemistry Recent Labs  Lab 10/16/17 1127  NA 142  K 3.3*  CL 103  CO2 27  GLUCOSE 80  BUN 10  CREATININE 0.68  CALCIUM 9.2  GFRNONAA >60  GFRAA >60  ANIONGAP 12    No results for input(s): PROT, ALBUMIN, AST, ALT, ALKPHOS, BILITOT in the last 168 hours. Hematology Recent Labs  Lab 10/16/17 1127  WBC 3.4*  RBC 4.56  HGB 14.2  HCT 40.2  MCV 88.2  MCH 31.1  MCHC 35.3  RDW 13.1  PLT 220   Cardiac Enzymes Recent Labs  Lab 10/16/17 1718 10/16/17 2328  TROPONINI <0.03 <0.03    Recent Labs  Lab 10/16/17 1133  TROPIPOC 0.00    BNPNo results for input(s): BNP, PROBNP in the last 168 hours.  DDimer  Recent Labs  Lab 10/16/17 1127  DDIMER <0.27    Radiology/Studies:  Dg Chest 2 View  Result Date: 10/16/2017 CLINICAL DATA:  Chest pain, sob, left arm pain, and rapid heart beat started this morning; smoker; medicated htn EXAM: CHEST - 2 VIEW COMPARISON:  none FINDINGS: Lungs are clear. Heart size and mediastinal contours are within normal limits. No effusion.  No pneumothorax. Visualized bones unremarkable.  Cholecystectomy clips. IMPRESSION: No acute cardiopulmonary  disease. Electronically Signed   By: Corlis Leak M.D.   On: 10/16/2017 11:08    Assessment and Plan:   Chest Pain -Pt with recent travel to the [place] for honeymoon. Developed weakness and palpitations and yesterday chest pressure that radiated to the neck, left shoulder and left arm with weakness, shortness of breath and being sweaty. Mostly resolved after and ativan in the ED but continues to be constant mild.  -Troponins negative x3, d-dimer was negative, chest x-ray showed no acute cardiopulmonary disease -EKG: Sinus rhythm, with diffuse nonspecific T wave abnormalities, no acute ischemia, no evolution -Lipid panel: TC 149, LDL 95, HDL 38, triglycerides 82 -Echocardiogram: Normal LV systolic function, EF 55-60%, no valvular disease -CVD risk factors include hypertension, smoking -Her symptoms are mostly atypical, being continuous and not related to activity. She is low risk for coronary stenosis. Will check cardiac CTA for cardiac morphology, calcium score and coronary calcium burden.  -Dr. Duke Salvia to see pt.   Palpitations -Pt with known hx of PVCs.  -Worse irregular heart beats in the last few days. -Takes pseudoephedrine daily, and phentermine occasionally with a dose last week. Also took an OTC diuretic about the same time. -K+ 3.3 on admission. Supplementation given by primary team. Will recheck BMet.  -  Possibly the combination of meds and travel with dehydration contributing to her increase in symptoms.  -Tele shows sinus rhythm with occ PAC's, in the 60's-70's, no tachyarrhythmias  Hypertension -Well controlled at home on bystolic 2.5 mg per pt -BP was elevated here, better this am, 135/80  Tobacco abuse -Smokes 10-15 cigarettes per day, advised on cessation  For questions or updates, please contact [facility] Please consult [email or web address] for contact info under Cardiology/STEMI.   Signed, Berton Bon, NP  10/17/2017 7:25 AM

## 2017-10-17 NOTE — Discharge Instructions (Signed)
Nonspecific Chest Pain °Chest pain can be caused by many different conditions. There is always a chance that your pain could be related to something serious, such as a heart attack or a blood clot in your lungs. Chest pain can also be caused by conditions that are not life-threatening. If you have chest pain, it is very important to follow up with your health care provider. °What are the causes? °Causes of this condition include: °· Heartburn. °· Pneumonia or bronchitis. °· Anxiety or stress. °· Inflammation around your heart (pericarditis) or lung (pleuritis or pleurisy). °· A blood clot in your lung. °· A collapsed lung (pneumothorax). This can develop suddenly on its own (spontaneous pneumothorax) or from trauma to the chest. °· Shingles infection (varicella-zoster virus). °· Heart attack. °· Damage to the bones, muscles, and cartilage that make up your chest wall. This can include: °? Bruised bones due to injury. °? Strained muscles or cartilage due to frequent or repeated coughing or overwork. °? Fracture to one or more ribs. °? Sore cartilage due to inflammation (costochondritis). ° °What increases the risk? °Risk factors for this condition may include: °· Activities that increase your risk for trauma or injury to your chest. °· Respiratory infections or conditions that cause frequent coughing. °· Medical conditions or overeating that can cause heartburn. °· Heart disease or family history of heart disease. °· Conditions or health behaviors that increase your risk of developing a blood clot. °· Having had chicken pox (varicella zoster). ° °What are the signs or symptoms? °Chest pain can feel like: °· Burning or tingling on the surface of your chest or deep in your chest. °· Crushing, pressure, aching, or squeezing pain. °· Dull or sharp pain that is worse when you move, cough, or take a deep breath. °· Pain that is also felt in your back, neck, shoulder, or arm, or pain that spreads to any of these  areas. ° °Your chest pain may come and go, or it may stay constant. °How is this diagnosed? °Lab tests or other studies may be needed to find the cause of your pain. Your health care provider may have you take a test called an ECG (electrocardiogram). An ECG records your heartbeat patterns at the time the test is performed. You may also have other tests, such as: °· Transthoracic echocardiogram (TTE). In this test, sound waves are used to create a picture of the heart structures and to look at how blood flows through your heart. °· Transesophageal echocardiogram (TEE). This is a more advanced imaging test that takes images from inside your body. It allows your health care provider to see your heart in finer detail. °· Cardiac monitoring. This allows your health care provider to monitor your heart rate and rhythm in real time. °· Holter monitor. This is a portable device that records your heartbeat and can help to diagnose abnormal heartbeats. It allows your health care provider to track your heart activity for several days, if needed. °· Stress tests. These can be done through exercise or by taking medicine that makes your heart beat more quickly. °· Blood tests. °· Other imaging tests. ° °How is this treated? °Treatment depends on what is causing your chest pain. Treatment may include: °· Medicines. These may include: °? Acid blockers for heartburn. °? Anti-inflammatory medicine. °? Pain medicine for inflammatory conditions. °? Antibiotic medicine, if an infection is present. °? Medicines to dissolve blood clots. °? Medicines to treat coronary artery disease (CAD). °· Supportive care for conditions that   do not require medicines. This may include: °? Resting. °? Applying heat or cold packs to injured areas. °? Limiting activities until pain decreases. ° °Follow these instructions at home: °Medicines °· If you were prescribed an antibiotic, take it as told by your health care provider. Do not stop taking the  antibiotic even if you start to feel better. °· Take over-the-counter and prescription medicines only as told by your health care provider. °Lifestyle °· Do not use any products that contain nicotine or tobacco, such as cigarettes and e-cigarettes. If you need help quitting, ask your health care provider. °· Do not drink alcohol. °· Make lifestyle changes as directed by your health care provider. These may include: °? Getting regular exercise. Ask your health care provider to suggest some activities that are safe for you. °? Eating a heart-healthy diet. A registered dietitian can help you to learn healthy eating options. °? Maintaining a healthy weight. °? Managing diabetes, if necessary. °? Reducing stress, such as with yoga or relaxation techniques. °General instructions °· Avoid any activities that bring on chest pain. °· If heartburn is the cause for your chest pain, raise (elevate) the head of your bed about 6 inches (15 cm) by putting blocks under the legs. Sleeping with more pillows does not effectively relieve heartburn because it only changes the position of your head. °· Keep all follow-up visits as told by your health care provider. This is important. This includes any further testing if your chest pain does not go away. °Contact a health care provider if: °· Your chest pain does not go away. °· You have a rash with blisters on your chest. °· You have a fever. °· You have chills. °Get help right away if: °· Your chest pain is worse. °· You have a cough that gets worse, or you cough up blood. °· You have severe pain in your abdomen. °· You have severe weakness. °· You faint. °· You have sudden, unexplained chest discomfort. °· You have sudden, unexplained discomfort in your arms, back, neck, or jaw. °· You have shortness of breath at any time. °· You suddenly start to sweat, or your skin gets clammy. °· You feel nauseous or you vomit. °· You suddenly feel light-headed or dizzy. °· Your heart begins to beat  quickly, or it feels like it is skipping beats. °These symptoms may represent a serious problem that is an emergency. Do not wait to see if the symptoms will go away. Get medical help right away. Call your local emergency services (911 in the [place] Do not drive yourself to the hospital. °This information is not intended to replace advice given to you by your health care provider. Make sure you discuss any questions you have with your health care provider. °Document Released: 12/01/2004 Document Revised: 11/16/2015 Document Reviewed: 11/16/2015 °[employer] Interactive Patient Education © 2017 [employer] ° °

## 2017-10-17 NOTE — Discharge Summary (Signed)
Physician Discharge Summary  Kelly Salazar ZOX:[id number] DOB: [birth year] DOA: 10/16/2017  PCP: Johna Sheriff, MD  Admit date: 10/16/2017 Discharge date: 10/17/2017  Admitted From: Home Disposition: Home  Recommendations for Outpatient Follow-up:  1. Follow up with PCP in 1 week 2. Please obtain BMP/CBC in one week 3. Recommend TSH for continued palpitations 4. Please follow up on the following pending results: None  Home Health: None Equipment/Devices: None  Discharge Condition: Stable CODE STATUS: Full code Diet recommendation: Heart healthy   Brief/Interim Summary:  Admission HPI written by Onnie Boer, MD   Chief Complaint: Chest pain  HPI: Kelly Salazar is a 47 y.o. female with medical history significant for anxiety with panic attacks, IBS, who presented to the ED with complaints of palpitations that started last night, unprovoked, while she was resting, and wheezing/pressure-like chest pains radiating to her left jaw and arm, that started this morning when she woke up from bed.  Chest pain is associated with shortness of breath diaphoresis and nausea.  She took Xanax and Zantac without relief. Pain was persistent till she got to the ED, improved after IV Ativan was given.  Father had cardiac stents placed in his 15s. Patient traveled to the [place] came back 2 days ago.  Denies leg swelling redness or pain.  Patient smokes cigarettes half a pack to a pack a day.  Occasional alcohol use.  Denies IV drug use.  She reports she used to have hyperventilation with palpitations over 10 years ago related to her panic attacks, but none since she has been taking Xanax 0.5 mg 4 times daily, and as needed Zantac.  ED Course: Blood pressure systolic 120s to 811B otherwise stable.  Troponin x1-neg,  K mildly low 3.3.  UDS positive for amphetamines and benzo.  D-dimer negative.  EKG shows mild ST depression 2 3 aVF.  No old EKG to compare.  Hospitalist was called to admit for  chest pain rule out ACS.    Hospital course:  Chest pain Atypical. EKG with non-specific t-wave changes in inferior/anterior leads. UDS positive for amphetamines in setting of phentermine use. Transthoracic Echocardiogram unremarkable. Troponin trended and negative. Cardiology consulted and recommended a cardiac CTA, which was negative for obstructive disease. Patient is at high risk for future cardiac events. Started on Lipitor. Outpatient follow-up with cardiology. Recommended to stop phentermine and pseudoephedrine.   Anxiety Continue Xanax  Essential hypertension Continue Bystolic  Discharge Diagnoses:  Active Problems:   Chest pain    Discharge Instructions  Discharge Instructions    Diet - low sodium heart healthy   Complete by:  As directed    Increase activity slowly   Complete by:  As directed      Allergies as of 10/17/2017      Reactions   Penicillin G Anaphylaxis   Has patient had a PCN reaction causing immediate rash, facial/tongue/throat swelling, SOB or lightheadedness with hypotension: No Has patient had a PCN reaction causing severe rash involving mucus membranes or skin necrosis: No Has patient had a PCN reaction that required hospitalization: Yes Has patient had a PCN reaction occurring within the last 10 years: No If all of the above answers are "NO", then may proceed with Cephalosporin use.   Penicillins Shortness Of Breath   Has patient had a PCN reaction causing immediate rash, facial/tongue/throat swelling, SOB or lightheadedness with hypotension: Yes Has patient had a PCN reaction causing severe rash involving mucus membranes or skin necrosis: No Has  patient had a PCN reaction that required hospitalization: Yes Has patient had a PCN reaction occurring within the last 10 years: No If all of the above answers are "NO", then may proceed with Cephalosporin use.   Shellfish Allergy Anaphylaxis   Amoxil [amoxicillin]    Has patient had a PCN reaction  causing immediate rash, facial/tongue/throat swelling, SOB or lightheadedness with hypotension: Yes Has patient had a PCN reaction causing severe rash involving mucus membranes or skin necrosis: No Has patient had a PCN reaction that required hospitalization: No Has patient had a PCN reaction occurring within the last 10 years: Yes If all of the above answers are "NO", then may proceed with Cephalosporin use.   Doxycycline Other (See Comments)   Heart palpitations      Medication List    STOP taking these medications   ibuprofen 200 MG tablet Commonly known as:  ADVIL,MOTRIN   naproxen sodium 220 MG tablet Commonly known as:  ALEVE     TAKE these medications   ALPRAZolam 1 MG tablet Commonly known as:  XANAX Take 1 tablet (1 mg total) by mouth 2 (two) times daily as needed for anxiety.   alum & mag hydroxide-simeth 200-200-20 MG/5ML suspension Commonly known as:  MAALOX/MYLANTA Take 15 mLs by mouth as needed for indigestion or heartburn.   aspirin EC 81 MG tablet Take 81 mg by mouth daily as needed (chest pain).   BYSTOLIC 5 MG tablet Generic drug:  nebivolol Take 2.5 mg by mouth daily. What changed:  Another medication with the same name was removed. Continue taking this medication, and follow the directions you see here.   dicyclomine 10 MG capsule Commonly known as:  BENTYL Take 1 capsule (10 mg total) by mouth 2 (two) times daily before a meal.   psyllium 28 % packet Commonly known as:  METAMUCIL SMOOTH TEXTURE Take 1 packet by mouth daily.   ranitidine 150 MG tablet Commonly known as:  ZANTAC Take 150 mg by mouth daily as needed for heartburn (indigestion).   rosuvastatin 10 MG tablet Commonly known as:  CRESTOR Take 1 tablet (10 mg total) by mouth daily at 6 PM.   VITAMIN B 12 PO Take 1 tablet by mouth daily.   VITAMIN C PO Take 1 packet by mouth daily.   VITAMIN D PO Take 1 tablet by mouth daily.      Follow-up Information    Chilton Siandolph, Tiffany,  MD Follow up.   Specialty:  Cardiology Why:  Cardiology follow up on September 10th at 9:00. Please arrive 15 minutes early for check in.  Contact information: [address] [place] [address] [phone]        Johna SheriffVincent, Carol L, MD. Schedule an appointment as soon as possible for a visit in 1 week(s).   Specialty:  Pediatrics Contact information: [address] [place] [address] [phone]          Allergies  Allergen Reactions  . Penicillin G Anaphylaxis    Has patient had a PCN reaction causing immediate rash, facial/tongue/throat swelling, SOB or lightheadedness with hypotension: No Has patient had a PCN reaction causing severe rash involving mucus membranes or skin necrosis: No Has patient had a PCN reaction that required hospitalization: Yes Has patient had a PCN reaction occurring within the last 10 years: No If all of the above answers are "NO", then may proceed with Cephalosporin use.   Marland Kitchen. Penicillins Shortness Of Breath    Has patient had a PCN  reaction causing immediate rash, facial/tongue/throat swelling, SOB or lightheadedness with hypotension: Yes Has patient had a PCN reaction causing severe rash involving mucus membranes or skin necrosis: No Has patient had a PCN reaction that required hospitalization: Yes Has patient had a PCN reaction occurring within the last 10 years: No If all of the above answers are "NO", then may proceed with Cephalosporin use.   . Shellfish Allergy Anaphylaxis  . Amoxil [Amoxicillin]     Has patient had a PCN reaction causing immediate rash, facial/tongue/throat swelling, SOB or lightheadedness with hypotension: Yes Has patient had a PCN reaction causing severe rash involving mucus membranes or skin necrosis: No Has patient had a PCN reaction that required hospitalization: No Has patient had a PCN reaction occurring within the last 10 years: Yes If all of the above answers are "NO", then may proceed with  Cephalosporin use.   Marland Kitchen Doxycycline Other (See Comments)    Heart palpitations    Consultations:  Cardiology   Procedures/Studies: Dg Chest 2 View  Result Date: 10/16/2017 CLINICAL DATA:  Chest pain, sob, left arm pain, and rapid heart beat started this morning; smoker; medicated htn EXAM: CHEST - 2 VIEW COMPARISON:  none FINDINGS: Lungs are clear. Heart size and mediastinal contours are within normal limits. No effusion.  No pneumothorax. Visualized bones unremarkable.  Cholecystectomy clips. IMPRESSION: No acute cardiopulmonary disease. Electronically Signed   By: Corlis Leak M.D.   On: 10/16/2017 11:08   Ct Coronary Morph W/cta Cor W/score W/ca W/cm &/or Wo/cm  Addendum Date: 10/17/2017   ADDENDUM REPORT: 10/17/2017 14:08 CLINICAL DATA:  Chest pain EXAM: Cardiac CTA MEDICATIONS: Sub lingual nitro. 4mg  x 2 TECHNIQUE: The patient was scanned on a Siemens 192 slice scanner. Gantry rotation speed was 250 msecs. Collimation was 0.8 mm. A 100 kV prospective scan was triggered in the ascending thoracic aorta at 35-75% of the R-R interval. Average HR during the scan was 60 bpm. The 3D data set was interpreted on a dedicated work station using MPR, MIP and VRT modes. A total of 80cc of contrast was used. FINDINGS: Non-cardiac: See separate report from [facility]. Calcium Score: 2 Agatston units Coronary Arteries: Right dominant with no anomalies LM: No plaque or stenosis. LAD system: Small area of calcified plaque proximal LAD, no significant stenosis. Circumflex system: No plaque or stenosis. RCA system: No plaque or stenosis. IMPRESSION: 1. Coronary artery calcium score 2 Agatston units. This places the patient in the 90th percentile for age and gender, suggesting high risk for future cardiac events. 2.  No obstructive coronary disease. Dalton Mclean Electronically Signed   By: Marca Ancona M.D.   On: 10/17/2017 14:08   Result Date: 10/17/2017 EXAM: OVER-READ INTERPRETATION CT CHEST The  following report is an over-read performed by radiologist Dr. Hulan Saas of [facility] on 10/17/2017. This over-read does not include interpretation of cardiac or coronary anatomy or pathology. The coronary calcium score/coronary CTA interpretation by the cardiologist is attached. COMPARISON:  None. FINDINGS: Vascular: No visible atherosclerosis and no evidence of aneurysm involving the visualized aorta. Mediastinum/Nodes: No pathologic lymphadenopathy within the visualized mediastinum. Visualized esophagus normal in appearance. Lungs/Pleura: Visualized lung parenchyma clear. Central bronchi patent without significant bronchial wall thickening. No pleural effusions. Upper Abdomen: Unremarkable for the early arterial phase of enhancement which accounts for the heterogeneous splenic enhancement. Musculoskeletal: Visualized skeleton unremarkable. IMPRESSION: No extracardiac findings. Electronically Signed: By: Hulan Saas M.D. On: 10/17/2017 13:11    10/16/17: Transthoracic Echocardiogram  Study Conclusions  -  Left ventricle: The cavity size was normal. Wall thickness was   normal. Systolic function was normal. The estimated ejection   fraction was in the range of 55% to 60%.   Subjective: No chest pain this morning  Discharge Exam: Vitals:   10/17/17 1130 10/17/17 1331  BP: (!) 148/83 133/88  Pulse: 68 (!) 55  Resp:  16  Temp:  98.1 F (36.7 C)  SpO2:  99%   Vitals:   10/16/17 2214 10/17/17 0518 10/17/17 1130 10/17/17 1331  BP: (!) 150/95 135/80 (!) 148/83 133/88  Pulse: 75 71 68 (!) 55  Resp:    16  Temp: 98.5 F (36.9 C) 98 F (36.7 C)  98.1 F (36.7 C)  TempSrc: Oral Oral  Oral  SpO2: 99% 99%  99%  Weight:      Height:        General: Pt is alert, awake, not in acute distress Cardiovascular: RRR, S1/S2 +, no rubs, no gallops Respiratory: CTA bilaterally, no wheezing, no rhonchi Abdominal: Soft, NT, ND, bowel sounds + Extremities: no edema, no  cyanosis    The results of significant diagnostics from this hospitalization (including imaging, microbiology, ancillary and laboratory) are listed below for reference.     Microbiology: No results found for this or any previous visit (from the past 240 hour(s)).   Labs: BNP (last 3 results) No results for input(s): BNP in the last 8760 hours. Basic Metabolic Panel: Recent Labs  Lab 10/16/17 1127 10/17/17 0452  NA 142 140  K 3.3* 4.3  CL 103 105  CO2 27 28  GLUCOSE 80 85  BUN 10 8  CREATININE 0.68 0.63  CALCIUM 9.2 8.3*   Liver Function Tests: No results for input(s): AST, ALT, ALKPHOS, BILITOT, PROT, ALBUMIN in the last 168 hours. No results for input(s): LIPASE, AMYLASE in the last 168 hours. No results for input(s): AMMONIA in the last 168 hours. CBC: Recent Labs  Lab 10/16/17 1127  WBC 3.4*  HGB 14.2  HCT 40.2  MCV 88.2  PLT 220   Cardiac Enzymes: Recent Labs  Lab 10/16/17 1718 10/16/17 2328  TROPONINI <0.03 <0.03   BNP: Invalid input(s): POCBNP CBG: No results for input(s): GLUCAP in the last 168 hours. D-Dimer Recent Labs    10/16/17 1127  DDIMER <0.27   Hgb A1c No results for input(s): HGBA1C in the last 72 hours. Lipid Profile Recent Labs    10/17/17 0452  CHOL 149  HDL 38*  LDLCALC 95  TRIG 82  CHOLHDL 3.9    SIGNED:   Jacquelin Hawkingalph Nettey, MD [facility] 10/17/2017, 4:20 PM

## 2017-11-14 ENCOUNTER — Ambulatory Visit: Payer: BLUE CROSS/BLUE SHIELD | Admitting: Cardiovascular Disease

## 2017-11-14 VITALS — BP 139/94 | HR 93 | Ht 66.0 in | Wt 160.0 lb

## 2017-11-14 DIAGNOSIS — R002 Palpitations: Secondary | ICD-10-CM | POA: Diagnosis not present

## 2017-11-14 DIAGNOSIS — Z5181 Encounter for therapeutic drug level monitoring: Secondary | ICD-10-CM | POA: Diagnosis not present

## 2017-11-14 DIAGNOSIS — I10 Essential (primary) hypertension: Secondary | ICD-10-CM | POA: Diagnosis not present

## 2017-11-14 DIAGNOSIS — Z720 Tobacco use: Secondary | ICD-10-CM

## 2017-11-14 DIAGNOSIS — E7800 Pure hypercholesterolemia, unspecified: Secondary | ICD-10-CM

## 2017-11-14 DIAGNOSIS — I2510 Atherosclerotic heart disease of native coronary artery without angina pectoris: Secondary | ICD-10-CM | POA: Diagnosis not present

## 2017-11-14 MED ORDER — VALSARTAN 80 MG PO TABS: 80.0000 mg | ORAL_TABLET | Freq: Every day | ORAL | 5 refills | Status: AC

## 2017-11-14 NOTE — Patient Instructions (Signed)
Medication Instructions:  START VALSARTAN 80 MG DAILY   Labwork: BMET IN 1 WEEK  Testing/Procedures: NONE  Follow-Up: Your physician recommends that you schedule a follow-up appointment in: 1 MONTH   Any Other Special Instructions Will Be Listed Below (If Applicable).  IF YOUR BLOOD PRESSURE GETS BELOW 120 OK TO STOP YOUR BYSTOLIC  MONITOR AND LOG YOUR BLOOD PRESSURE, BRING READINGS TO FOLLOW UP    If you need a refill on your cardiac medications before your next appointment, please call your pharmacy.

## 2017-11-14 NOTE — Progress Notes (Signed)
Cardiology Office Note   Date:  11/15/2017   ID:  Kelly Salazar, DOB December 07, 1970, MRN [id number]  PCP:  Kelly Harold, PA-C  Cardiologist:   Chilton Si, MD   No chief complaint on file.     History of Present Illness: Kelly Salazar is a 47 y.o. female with non-obstructive CAD, hypertension, tobacco abuse, and anxiety here for follow up.  Ms. Lichtenstein was admitted to the hospital 10/2017 with chest pain.  Her pain awakened her from sleep.  At the time she was taking phentermine and also reported frequent PVCs.  Cardiac enzymes were negative but EKG showed anterior T wave inversions.  She had a coronary CT-A that revealed mild, non-obstructive CAD in the LAD.  However, she was 90th percentile for future events.  Therefore rosuvastatin was ordered and smoking cessation was advised.  Since discharge from the hospital she has not had any more chest pain.  She is down to smoking 5 cigarettes from 1 pack daily.  She decided not to fill the rosuvastatin and to work on diet and exercise instead.  She has been walking her dog for 2 miles daily.  She has no exertional symptoms.  She is also starting to work on her diet.  She has no lower extremity edema, orthopnea, or PND.  Her blood pressure has been running high at home.  She notes that the diastolic is frequently over 100.  She tried taking extra nebivolol but then she becomes bradycardic.  She notes that Diovan worked well for her 10 years ago.  This was ultimately switched to metoprolol and then nebivolol in order to help with her anxiety.  She has a strong family history of CAD.  Her father had PCI in his 66s.  Past Medical History:  Diagnosis Date  . Anxiety   . CAD (coronary artery disease), native coronary artery 11/15/2017   Mild CAD on cardiac CT-10/2017.  Calcium score 90th percentile.  . Endometriosis   . Hypertension   . IBS (irritable bowel syndrome)   . Kidney stones     Past Surgical History:  Procedure Laterality Date  .  CHOLECYSTECTOMY    . COLONOSCOPY N/A 10/31/2012   Procedure: COLONOSCOPY;  Surgeon: Malissa Hippo, MD;  Location: [facility];  Service: Endoscopy;  Laterality: N/A;  105-moved to 1220 Ann to notify pt  . LAPAROSCOPIC ABDOMINAL EXPLORATION     looking for endometriosis     Current Outpatient Medications  Medication Sig Dispense Refill  . ALPRAZolam (XANAX) 1 MG tablet Take 1 tablet (1 mg total) by mouth 2 (two) times daily as needed for anxiety. 60 tablet 0  . alum & mag hydroxide-simeth (MAALOX/MYLANTA) 200-200-20 MG/5ML suspension Take 15 mLs by mouth as needed for indigestion or heartburn.    . Ascorbic Acid (VITAMIN C PO) Take 1 packet by mouth daily.    Marland Kitchen aspirin EC 81 MG tablet Take 81 mg by mouth daily as needed (chest pain).    . BYSTOLIC 5 MG tablet Take 2.5 mg by mouth daily.  0  . ranitidine (ZANTAC) 150 MG tablet Take 150 mg by mouth daily as needed for heartburn (indigestion).    . valsartan (DIOVAN) 80 MG tablet Take 1 tablet (80 mg total) by mouth daily. 30 tablet 5   No current facility-administered medications for this visit.     Allergies:   Penicillin g; Penicillins; Shellfish allergy; Amoxil [amoxicillin]; and Doxycycline    Social History:  The patient  reports  that she has been smoking cigarettes. She has a 6.00 pack-year smoking history. She has never used smokeless tobacco. She reports that she does not drink alcohol or use drugs.   Family History:  The patient's family history includes CAD in her paternal grandmother; Healthy in her mother; Hypertension in her father; Other in her other.    ROS:  Please see the history of present illness.   Otherwise, review of systems are positive for none.   All other systems are reviewed and negative.    PHYSICAL EXAM: VS:  BP (!) 139/94   Pulse 93   Ht 5\' 6"  (1.676 m)   Wt 160 lb (72.6 kg)   BMI 25.82 kg/m  , BMI Body mass index is 25.82 kg/m. GENERAL:  Well appearing HEENT:  Pupils equal round and reactive,  fundi not visualized, oral mucosa unremarkable NECK:  No jugular venous distention, waveform within normal limits, carotid upstroke brisk and symmetric, no bruits, no thyromegaly LYMPHATICS:  No cervical adenopathy LUNGS:  Clear to auscultation bilaterally HEART:  RRR.  PMI not displaced or sustained,S1 and S2 within normal limits, no S3, no S4, no clicks, no rubs, no murmurs ABD:  Flat, positive bowel sounds normal in frequency in pitch, no bruits, no rebound, no guarding, no midline pulsatile mass, no hepatomegaly, no splenomegaly EXT:  2 plus pulses throughout, no edema, no cyanosis no clubbing SKIN:  No rashes no nodules NEURO:  Cranial nerves II through XII grossly intact, motor grossly intact throughout PSYCH:  Cognitively intact, oriented to person place and time    EKG:  EKG is not ordered today.   Recent Labs: 10/16/2017: Hemoglobin 14.2; Platelets 220 10/17/2017: BUN 8; Creatinine, Ser 0.63; Potassium 4.3; Sodium 140    Lipid Panel    Component Value Date/Time   CHOL 149 10/17/2017 0452   TRIG 82 10/17/2017 0452   HDL 38 (L) 10/17/2017 0452   CHOLHDL 3.9 10/17/2017 0452   VLDL 16 10/17/2017 0452   LDLCALC 95 10/17/2017 0452      Wt Readings from Last 3 Encounters:  11/14/17 160 lb (72.6 kg)  10/16/17 150 lb (68 kg)  08/21/13 180 lb 9.6 oz (81.9 kg)      ASSESSMENT AND PLAN:  # Non-obstructive CAD: # Hyperlipidemia:  Her CAD is non-obstructive.  It is not the cause of her angina.  Interestingly she did have T wave inversions on EKG.  It is possible that she has episodes of coronary spasm.  Her EKG did not reveal LVH but it could also be related to repolarization abnormalities.  Continue aspirin and nebivolol.  She wants to work on exercise and diet, which is reasonable given that her baseline LDL was 95.  We again recommended smoking cessation.  We will repeat her lipids and CMP in 3 months.  If her LDL is still greater than 70 then she will start a statin.  #  Hypertension: Blood pressure is elevated.  We will add losartan 80 mg daily.  Continue nebivolol.  Her goal is less than 130/80.  # Tobacco abuse: We again discussed the fact that smoking cessation is imperative.  She has cut back substantially and does not want any pharmacologic assistance.  We discussed smoking cessation for 5 minutes.   Current medicines are reviewed at length with the patient today.  The patient does not have concerns regarding medicines.  The following changes have been made:  Add valsartan  Labs/ tests ordered today include:   Orders Placed This  Encounter  Procedures  . Basic metabolic panel     Disposition:   FU with [place] C. Duke Salvia, MD, [facility] in 1 month.     Signed, Hosteen Kienast C. Duke Salvia, MD, [facility]  11/15/2017 9:26 AM    [facility]

## 2017-11-15 ENCOUNTER — Encounter: Payer: Self-pay | Admitting: Cardiovascular Disease

## 2017-11-15 DIAGNOSIS — I2510 Atherosclerotic heart disease of native coronary artery without angina pectoris: Secondary | ICD-10-CM

## 2017-11-15 HISTORY — DX: Atherosclerotic heart disease of native coronary artery without angina pectoris: I25.10

## 2017-11-23 ENCOUNTER — Telehealth: Payer: Self-pay | Admitting: *Deleted

## 2017-11-23 LAB — BASIC METABOLIC PANEL
BUN/Creatinine Ratio: 14 (ref 9–23)
BUN: 10 mg/dL (ref 6–24)
CO2: 23 mmol/L (ref 20–29)
Calcium: 9.3 mg/dL (ref 8.7–10.2)
Chloride: 101 mmol/L (ref 96–106)
Creatinine, Ser: 0.7 mg/dL (ref 0.57–1.00)
GFR calc Af Amer: 119 mL/min/{1.73_m2} (ref >59)
GFR calc non Af Amer: 104 mL/min/{1.73_m2} (ref >59)
Glucose: 91 mg/dL (ref 65–99)
Potassium: 4.6 mmol/L (ref 3.5–5.2)
Sodium: 142 mmol/L (ref 134–144)

## 2017-11-23 MED ORDER — NITROGLYCERIN 0.4 MG SL SUBL: 0.4000 mg | SUBLINGUAL_TABLET | SUBLINGUAL | 3 refills | Status: AC | PRN

## 2017-11-23 NOTE — Telephone Encounter (Signed)
At visit Dr Duke Salviaandolph and patient discussed NTG but it was decided against. Patient would feel better having NTG on hand. Discussed with Dr Duke Salviaandolph and ok to give NTG 0.4 mg SL PRN chest pains  Left message to call back

## 2017-11-30 ENCOUNTER — Encounter: Payer: Self-pay | Admitting: *Deleted

## 2017-11-30 NOTE — Telephone Encounter (Signed)
Patient never called back, sent mychart message

## 2017-12-15 ENCOUNTER — Ambulatory Visit: Payer: BLUE CROSS/BLUE SHIELD | Admitting: Cardiovascular Disease

## 2019-05-14 IMAGING — CT CT HEART MORP W/ CTA COR W/ SCORE W/ CA W/CM &/OR W/O CM
4 of 7 series · 8 of 20 positions shown, 9 images · IV contrast (APPLIED)
Comparison: None.

CLINICAL DATA: Chest pain

EXAM:
Cardiac CTA
MEDICATIONS:
Sub lingual nitro. 4mg x 2
TECHNIQUE: The patient was scanned on a Siemens [REDACTED]ice scanner. Gantry
rotation speed was 250 msecs. Collimation was 0.8 mm. A 100 kV
prospective scan was triggered in the ascending thoracic aorta at
35-75% of the R-R interval. Average HR during the scan was 60 bpm.
The 3D data set was interpreted on a dedicated work station using
MPR, MIP and VRT modes. A total of 80cc of contrast was used.

[Series 6: best diast 73 % · axial · 0.39mm/px · z∈[+1045,+1089]mm · 2 of 331 slices shown]
[im 111/331  vessel]
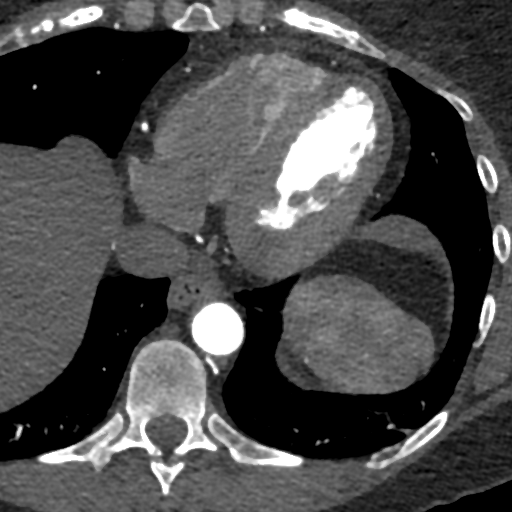
[im 221/331  vessel]
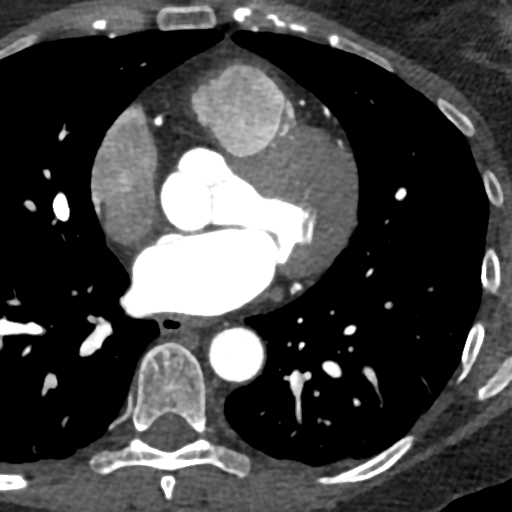

[Series 7: best syst 37 % · axial · 0.39mm/px · z∈[+1045,+1089]mm · 2 of 331 slices shown]
[im 111/331  vessel]
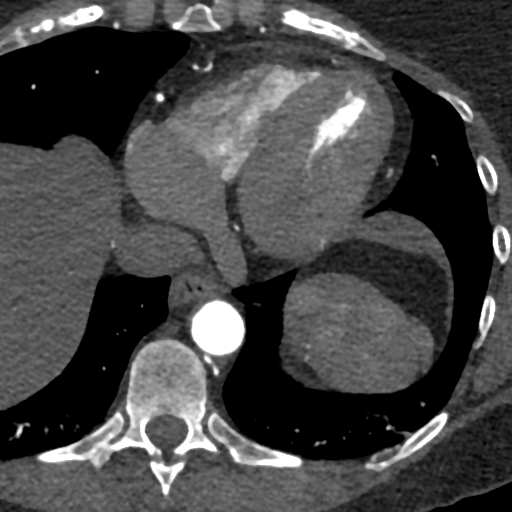
[im 221/331  vessel]
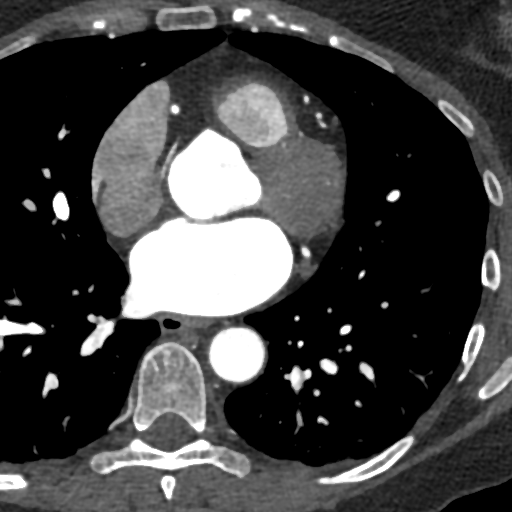

[Series 8: ts diast sharp 73 % · axial · 0.39mm/px · z∈[+1045,+1089]mm · 2 of 331 slices shown]
[im 111/331  lung]
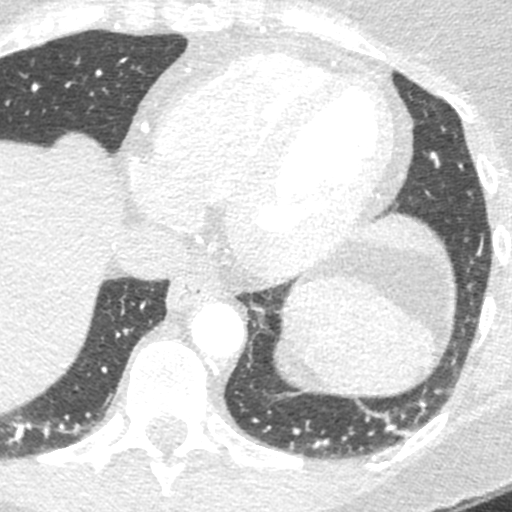
[im 221/331  lung]
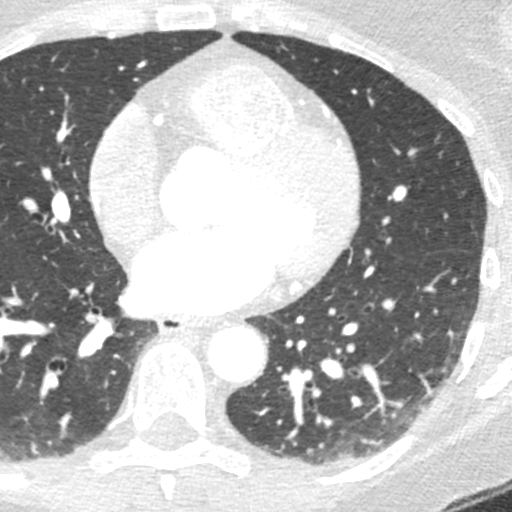

[Series 9: ts syst sharp · axial · 0.39mm/px · z∈[+1045,+1089]mm · 2 of 331 slices shown, 3 images]
[im 111/331  vessel]
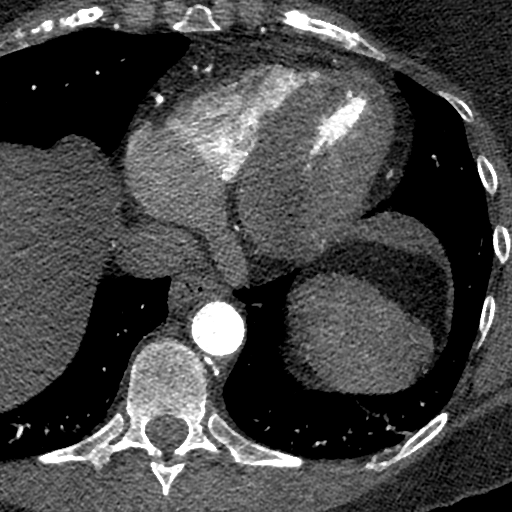
[im 111/331  lung]
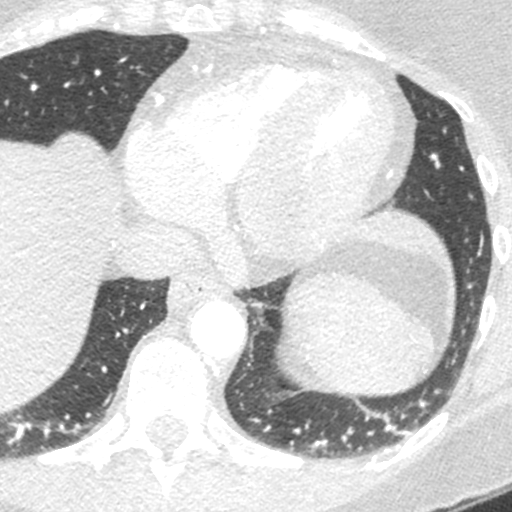
[im 221/331  vessel]
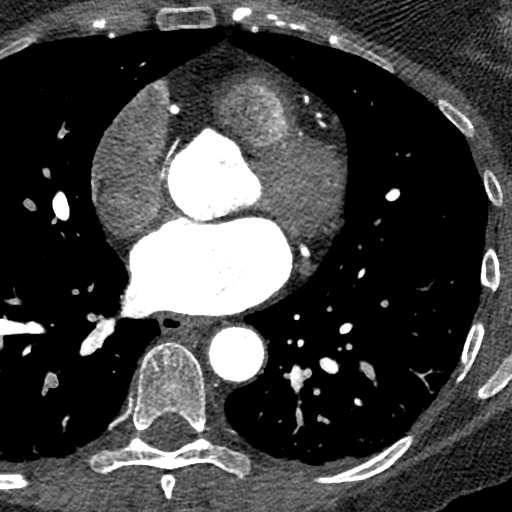

[8 of 20 positions shown; findings below may reference images not displayed]

FINDINGS: Non-cardiac: See separate report from [REDACTED].

Calcium Score: 2 Agatston units

Coronary Arteries: Right dominant with no anomalies

LM: No plaque or stenosis.

LAD system: Small area of calcified plaque proximal LAD, no
significant stenosis.

Circumflex system: No plaque or stenosis.

RCA system: No plaque or stenosis.
IMPRESSION: 1. Coronary artery calcium score 2 Agatston units. This places the
patient in the 90th percentile for age and gender, suggesting high
risk for future cardiac events.

2.  No obstructive coronary disease.

Eric Noel Amieva

EXAM:
OVER-READ INTERPRETATION CT CHEST

The following report is an over-read performed by radiologist Dr.
over-read does not include interpretation of cardiac or coronary
anatomy or pathology. The coronary calcium score/coronary CTA
interpretation by the cardiologist is attached.
FINDINGS: Vascular: No visible atherosclerosis and no evidence of aneurysm
involving the visualized aorta.

Mediastinum/Nodes: No pathologic lymphadenopathy within the
visualized mediastinum. Visualized esophagus normal in appearance.

Lungs/Pleura: Visualized lung parenchyma clear. Central bronchi
patent without significant bronchial wall thickening. No pleural
effusions.

Upper Abdomen: Unremarkable for the early arterial phase of
enhancement which accounts for the heterogeneous splenic
enhancement.

Musculoskeletal: Visualized skeleton unremarkable.
IMPRESSION: No extracardiac findings.

## 2022-09-26 ENCOUNTER — Encounter (INDEPENDENT_AMBULATORY_CARE_PROVIDER_SITE_OTHER): Payer: Self-pay | Admitting: *Deleted
